# Patient Record
Sex: Female | Born: 1960 | Race: Black or African American | Hispanic: No | Marital: Single | Smoking: Never smoker
Health system: Southern US, Community
[De-identification: ages and names within clinical notes are randomized; demographics above are authoritative.]

## PROBLEM LIST (undated history)

## (undated) DIAGNOSIS — I10 Essential (primary) hypertension: Secondary | ICD-10-CM

## (undated) DIAGNOSIS — D649 Anemia, unspecified: Secondary | ICD-10-CM

## (undated) DIAGNOSIS — R011 Cardiac murmur, unspecified: Secondary | ICD-10-CM

## (undated) DIAGNOSIS — F419 Anxiety disorder, unspecified: Secondary | ICD-10-CM

## (undated) DIAGNOSIS — E785 Hyperlipidemia, unspecified: Secondary | ICD-10-CM

## (undated) HISTORY — PX: REPLACEMENT TOTAL KNEE: SUR1224

## (undated) HISTORY — DX: Essential (primary) hypertension: I10

## (undated) HISTORY — PX: ABDOMINAL HYSTERECTOMY: SHX81

## (undated) HISTORY — PX: ARTHROSCOPIC REPAIR ACL: SUR80

## (undated) HISTORY — DX: Hyperlipidemia, unspecified: E78.5

---

## 2003-01-25 HISTORY — PX: LAPAROSCOPIC SUPRACERVICAL HYSTERECTOMY: SUR797

## 2011-06-02 ENCOUNTER — Ambulatory Visit: Payer: Self-pay | Admitting: Unknown Physician Specialty

## 2019-03-13 DIAGNOSIS — Z23 Encounter for immunization: Secondary | ICD-10-CM | POA: Diagnosis not present

## 2019-04-03 DIAGNOSIS — Z23 Encounter for immunization: Secondary | ICD-10-CM | POA: Diagnosis not present

## 2019-05-01 ENCOUNTER — Ambulatory Visit: Payer: Self-pay | Admitting: Family Medicine

## 2019-05-29 ENCOUNTER — Other Ambulatory Visit: Payer: Self-pay

## 2019-05-29 ENCOUNTER — Encounter: Payer: Self-pay | Admitting: Family Medicine

## 2019-05-29 ENCOUNTER — Ambulatory Visit (INDEPENDENT_AMBULATORY_CARE_PROVIDER_SITE_OTHER): Payer: BC Managed Care – PPO | Admitting: Family Medicine

## 2019-05-29 VITALS — BP 154/98 | HR 90 | Temp 98.3°F | Resp 22 | Ht 69.0 in | Wt 314.2 lb

## 2019-05-29 DIAGNOSIS — I10 Essential (primary) hypertension: Secondary | ICD-10-CM

## 2019-05-29 DIAGNOSIS — Z1211 Encounter for screening for malignant neoplasm of colon: Secondary | ICD-10-CM

## 2019-05-29 DIAGNOSIS — Z114 Encounter for screening for human immunodeficiency virus [HIV]: Secondary | ICD-10-CM

## 2019-05-29 DIAGNOSIS — Z1159 Encounter for screening for other viral diseases: Secondary | ICD-10-CM | POA: Diagnosis not present

## 2019-05-29 DIAGNOSIS — Z6841 Body Mass Index (BMI) 40.0 and over, adult: Secondary | ICD-10-CM

## 2019-05-29 MED ORDER — LISINOPRIL 10 MG PO TABS: 10.0000 mg | ORAL_TABLET | Freq: Every day | ORAL | 3 refills | Status: DC

## 2019-05-29 NOTE — Progress Notes (Signed)
Subjective:     Akila Batta is a 59 y.o. female presenting for Establish Care (no PCP since around 2013 in [place]) and Back Pain (off and on pain in the Left lower back. x 1 week)     HPI  #HTN - previously diagnosed - no ha, vision change, sob, palpitations - did have some cp yesterday - but has a lot of anxiety and stressors - does not check blood pressure  #overweight - not exercising - knows what to eat, but has a hard time doing it - had lost 40 lbs down to 263 lbs was 303 lbs - has regained what she lost and more  #Back pain - lower left side - on and off - has happened a few times - was a few months ago - symptoms have resolve - no specific injury - nothing made it worse  Review of Systems   Social History   Tobacco Use  Smoking Status Never Smoker  Smokeless Tobacco Never Used        Objective:    BP Readings from Last 3 Encounters:  05/29/19 (!) 154/98   Wt Readings from Last 3 Encounters:  05/29/19 (!) 314 lb 4 oz (142.5 kg)    BP (!) 154/98   Pulse 90   Temp 98.3 F (36.8 C)   Resp (!) 22   Ht 5\' 9"  (1.753 m)   Wt (!) 314 lb 4 oz (142.5 kg)   SpO2 98%   BMI 46.41 kg/m    Physical Exam Constitutional:      General: She is not in acute distress.    Appearance: She is well-developed. She is obese. She is not diaphoretic.  HENT:     Right Ear: External ear normal.     Left Ear: External ear normal.     Nose: Nose normal.  Eyes:     Conjunctiva/sclera: Conjunctivae normal.  Cardiovascular:     Rate and Rhythm: Normal rate and regular rhythm.     Heart sounds: No murmur.  Pulmonary:     Effort: Pulmonary effort is normal. No respiratory distress.     Breath sounds: Normal breath sounds. No wheezing.  Musculoskeletal:     Cervical back: Neck supple.  Skin:    General: Skin is warm and dry.     Capillary Refill: Capillary refill takes less than 2 seconds.  Neurological:     Mental Status: She is alert. Mental status is at  baseline.  Psychiatric:        Mood and Affect: Mood normal.        Behavior: Behavior normal.    EKG: NSR, t-wave flattening, LVH based on voltage criteria. No acute ST changes       Assessment & Plan:   Problem List Items Addressed This Visit      Cardiovascular and Mediastinum   Hypertension - Primary    Given abnormal EKG and concern for LVH discussed importance of BP control. Will start lisinopril and lifestyle changes. Mychart in 2 weeks if bp not normal for dose increase. Return in 4 weeks for bp check and blood work. Labs today to screen for other risk factors.       Relevant Medications   ASPIRIN 81 PO   lisinopril (ZESTRIL) 10 MG tablet   Other Relevant Orders   TSH   Lipid panel   CBC   Comprehensive metabolic panel   Hemoglobin A1c   EKG 12-Lead (Completed)   ECHOCARDIOGRAM COMPLETE  Other   Morbid obesity with BMI of 45.0-49.9, adult ([place])    Motivated to work on diet and exercise. Discussed importance of commitment to lifestyle changes.       Relevant Orders   TSH   Lipid panel   CBC   Comprehensive metabolic panel   Hemoglobin A1c    Other Visit Diagnoses    Screening for HIV (human immunodeficiency virus)       Relevant Orders   HIV Antibody (routine testing w rflx)   Encounter for hepatitis C screening test for low risk patient       Relevant Orders   Hepatitis C antibody   Colon cancer screening       Relevant Orders   Fecal occult blood, imunochemical       Return in about 4 weeks (around 06/26/2019).  Lesleigh Noe, MD

## 2019-05-29 NOTE — Assessment & Plan Note (Signed)
Motivated to work on diet and exercise. Discussed importance of commitment to lifestyle changes.

## 2019-05-29 NOTE — Patient Instructions (Signed)
Your blood pressure high.   High blood pressure increases your risk for heart attack and stroke.    Start lisinopril 10 mg  If after 2 weeks blood pressure is still high, call or mychart to let me know and we will plan to increase medication.   Please check your blood pressure 2-4 times a week.   To check your blood pressure 1) Sit in a quiet and relaxed place for 5 minutes 2) Make sure your feet are flat on the ground 3) Consider checking first thing in the morning   Normal blood pressure is less than 140/90 Ideally you blood pressure should be around 120/80  Other ways you can reduce your blood pressure:  1) Regular exercise -- Try to get 150 minutes (30 minutes, 5 days a week) of moderate to vigorous aerobic excercise -- Examples: brisk walking (2.5 miles per hour), water aerobics, dancing, gardening, tennis, biking slower than 10 miles per hour 2) DASH Diet - low fat meats, more fresh fruits and vegetables, whole grains, low salt 3) Quit smoking if you smoke 4) Loose 5-10% of your body weight

## 2019-05-29 NOTE — Addendum Note (Signed)
Addended by: Alvina Chou on: 05/29/2019 02:26 PM   Modules accepted: Orders

## 2019-05-29 NOTE — Assessment & Plan Note (Signed)
Given abnormal EKG and concern for LVH discussed importance of BP control. Will start lisinopril and lifestyle changes. Mychart in 2 weeks if bp not normal for dose increase. Return in 4 weeks for bp check and blood work. Labs today to screen for other risk factors.

## 2019-06-05 ENCOUNTER — Other Ambulatory Visit: Payer: Self-pay

## 2019-06-05 ENCOUNTER — Other Ambulatory Visit (INDEPENDENT_AMBULATORY_CARE_PROVIDER_SITE_OTHER): Payer: BC Managed Care – PPO

## 2019-06-05 DIAGNOSIS — Z6841 Body Mass Index (BMI) 40.0 and over, adult: Secondary | ICD-10-CM

## 2019-06-05 DIAGNOSIS — Z114 Encounter for screening for human immunodeficiency virus [HIV]: Secondary | ICD-10-CM | POA: Diagnosis not present

## 2019-06-05 DIAGNOSIS — Z1211 Encounter for screening for malignant neoplasm of colon: Secondary | ICD-10-CM

## 2019-06-05 DIAGNOSIS — Z1159 Encounter for screening for other viral diseases: Secondary | ICD-10-CM

## 2019-06-05 DIAGNOSIS — I10 Essential (primary) hypertension: Secondary | ICD-10-CM | POA: Diagnosis not present

## 2019-06-05 LAB — COMPREHENSIVE METABOLIC PANEL
ALT: 47 U/L — ABNORMAL HIGH (ref 0–35)
AST: 56 U/L — ABNORMAL HIGH (ref 0–37)
Albumin: 4 g/dL (ref 3.5–5.2)
Alkaline Phosphatase: 49 U/L (ref 39–117)
BUN: 15 mg/dL (ref 6–23)
CO2: 29 mEq/L (ref 19–32)
Calcium: 9 mg/dL (ref 8.4–10.5)
Chloride: 104 mEq/L (ref 96–112)
Creatinine, Ser: 0.96 mg/dL (ref 0.40–1.20)
GFR: 71.81 mL/min (ref >60.00)
Glucose, Bld: 115 mg/dL — ABNORMAL HIGH (ref 70–99)
Potassium: 3.8 mEq/L (ref 3.5–5.1)
Sodium: 139 mEq/L (ref 135–145)
Total Bilirubin: 0.6 mg/dL (ref 0.2–1.2)
Total Protein: 6.9 g/dL (ref 6.0–8.3)

## 2019-06-05 LAB — CBC
HCT: 38.1 % (ref 36.0–46.0)
Hemoglobin: 12.9 g/dL (ref 12.0–15.0)
MCHC: 33.9 g/dL (ref 30.0–36.0)
MCV: 82.8 fl (ref 78.0–100.0)
Platelets: 278 10*3/uL (ref 150.0–400.0)
RBC: 4.6 Mil/uL (ref 3.87–5.11)
RDW: 15.5 % (ref 11.5–15.5)
WBC: 5.6 10*3/uL (ref 4.0–10.5)

## 2019-06-05 LAB — LIPID PANEL
Cholesterol: 238 mg/dL — ABNORMAL HIGH (ref 0–200)
HDL: 40.3 mg/dL (ref >39.00)
LDL Cholesterol: 172 mg/dL — ABNORMAL HIGH (ref 0–99)
NonHDL: 198.17
Total CHOL/HDL Ratio: 6
Triglycerides: 129 mg/dL (ref 0.0–149.0)
VLDL: 25.8 mg/dL (ref 0.0–40.0)

## 2019-06-05 LAB — HEMOGLOBIN A1C: Hgb A1c MFr Bld: 5.3 % (ref 4.6–6.5)

## 2019-06-05 LAB — TSH: TSH: 3.28 u[IU]/mL (ref 0.35–4.50)

## 2019-06-06 LAB — HEPATITIS C ANTIBODY
Hepatitis C Ab: NONREACTIVE
SIGNAL TO CUT-OFF: 0.29 (ref <1.00)

## 2019-06-06 LAB — HIV ANTIBODY (ROUTINE TESTING W REFLEX): HIV 1&2 Ab, 4th Generation: NONREACTIVE

## 2019-06-13 ENCOUNTER — Other Ambulatory Visit (INDEPENDENT_AMBULATORY_CARE_PROVIDER_SITE_OTHER): Payer: BC Managed Care – PPO

## 2019-06-13 DIAGNOSIS — Z1211 Encounter for screening for malignant neoplasm of colon: Secondary | ICD-10-CM | POA: Diagnosis not present

## 2019-06-13 LAB — FECAL OCCULT BLOOD, IMMUNOCHEMICAL: Fecal Occult Bld: NEGATIVE

## 2019-06-27 ENCOUNTER — Encounter: Payer: Self-pay | Admitting: Family Medicine

## 2019-06-27 ENCOUNTER — Ambulatory Visit: Payer: BC Managed Care – PPO | Admitting: Family Medicine

## 2019-06-27 ENCOUNTER — Other Ambulatory Visit: Payer: Self-pay

## 2019-06-27 VITALS — BP 152/84 | HR 98 | Temp 98.9°F | Ht 69.0 in | Wt 306.0 lb

## 2019-06-27 DIAGNOSIS — E782 Mixed hyperlipidemia: Secondary | ICD-10-CM

## 2019-06-27 DIAGNOSIS — R7989 Other specified abnormal findings of blood chemistry: Secondary | ICD-10-CM | POA: Diagnosis not present

## 2019-06-27 DIAGNOSIS — I10 Essential (primary) hypertension: Secondary | ICD-10-CM

## 2019-06-27 DIAGNOSIS — Z6841 Body Mass Index (BMI) 40.0 and over, adult: Secondary | ICD-10-CM

## 2019-06-27 MED ORDER — LISINOPRIL 20 MG PO TABS: 20.0000 mg | ORAL_TABLET | Freq: Every day | ORAL | 1 refills | Status: DC

## 2019-06-27 MED ORDER — ATORVASTATIN CALCIUM 10 MG PO TABS
10.0000 mg | ORAL_TABLET | Freq: Every day | ORAL | 3 refills | Status: DC
Start: 2019-06-27 — End: 2023-01-13

## 2019-06-27 NOTE — Assessment & Plan Note (Signed)
Doing great with diet/exercise and 8 lbs weight loss in 1 month. Continue efforts towards weight loss. Return in 3 months

## 2019-06-27 NOTE — Progress Notes (Signed)
Subjective:     Marissa Roman is a 58 y.o. female presenting for Hypertension and Weight Check     HPI  #HTN - taking medication at night - no side effects - summer vacation  - has not checked her BP at home - no cp, sob, ha, vision changes  #obesity - has lost weight - cut out fried foods - exercising - not working  Review of Systems   Social History   Tobacco Use  Smoking Status Never Smoker  Smokeless Tobacco Never Used        Objective:    BP Readings from Last 3 Encounters:  06/27/19 (!) 152/84  05/29/19 (!) 154/98   Wt Readings from Last 3 Encounters:  06/27/19 (!) 306 lb (138.8 kg)  05/29/19 (!) 314 lb 4 oz (142.5 kg)    BP (!) 152/84   Pulse 98   Temp 98.9 F (37.2 C) (Temporal)   Ht 5\' 9"  (1.753 m)   Wt (!) 306 lb (138.8 kg)   SpO2 98%   BMI 45.19 kg/m    Physical Exam Constitutional:      General: She is not in acute distress.    Appearance: She is well-developed. She is not diaphoretic.  HENT:     Right Ear: External ear normal.     Left Ear: External ear normal.  Eyes:     Conjunctiva/sclera: Conjunctivae normal.  Cardiovascular:     Rate and Rhythm: Normal rate and regular rhythm.     Heart sounds: No murmur.  Pulmonary:     Effort: Pulmonary effort is normal. No respiratory distress.     Breath sounds: Normal breath sounds. No wheezing.  Musculoskeletal:     Cervical back: Neck supple.  Skin:    General: Skin is warm and dry.     Capillary Refill: Capillary refill takes less than 2 seconds.  Neurological:     Mental Status: She is alert. Mental status is at baseline.  Psychiatric:        Mood and Affect: Mood normal.        Behavior: Behavior normal.      The 10-year ASCVD risk score Mikey Bussing DC Jr., et al., 2013) is: 15%   Values used to calculate the score:     Age: 52 years     Sex: Female     Is Non-Hispanic African American: Yes     Diabetic: No     Tobacco smoker: No     Systolic Blood Pressure: 213 mmHg      Is BP treated: Yes     HDL Cholesterol: 40.3 mg/dL     Total Cholesterol: 238 mg/dL  Lipid Panel     Component Value Date/Time   CHOL 238 (H) 06/05/2019 0858   TRIG 129.0 06/05/2019 0858   HDL 40.30 06/05/2019 0858   CHOLHDL 6 06/05/2019 0858   VLDL 25.8 06/05/2019 0858   LDLCALC 172 (H) 06/05/2019 0858        Assessment & Plan:   Problem List Items Addressed This Visit      Cardiovascular and Mediastinum   Hypertension - Primary    BP improved on lisinopril 10 mg but not at goal. Increase to 20 mg. She will get home cuff and call in 3-4 weeks if still elevated. Anticipate adding thiazide at that time if still elevated. DASH diet provided today and continued to encourage life style changes. Return in 3 months. BMP for monitoring today      Relevant  Medications   ASPIRIN 81 PO   atorvastatin (LIPITOR) 10 MG tablet   lisinopril (ZESTRIL) 20 MG tablet   Other Relevant Orders   Basic metabolic panel     Other   Morbid obesity with BMI of 45.0-49.9, adult ([facility])    Doing great with diet/exercise and 8 lbs weight loss in 1 month. Continue efforts towards weight loss. Return in 3 months      Mixed hyperlipidemia    Discussed ASCVD risk 15% and advised statin and asa. She will start both. Repeat lipids in 3 months.       Relevant Medications   ASPIRIN 81 PO   atorvastatin (LIPITOR) 10 MG tablet   lisinopril (ZESTRIL) 20 MG tablet   Elevated LFTs    Reviewed labs from last month. Discussed continued lifestyle. Will repeat in 3 months, if still elevated anticipate liver US to evaluate further at that time.           Return in about 3 months (around 09/27/2019).  Lynnda Child, MD

## 2019-06-27 NOTE — Patient Instructions (Addendum)
Start taking 20 mg of Lisinopril (2 tablets) Refill of 20 mg lisinopril sent to pharmacy  If blood pressure at home is still >140/90 call or MyChart in 3-4 weeks    DASH Eating Plan DASH stands for "Dietary Approaches to Stop Hypertension." The DASH eating plan is a healthy eating plan that has been shown to reduce high blood pressure (hypertension). It may also reduce your risk for type 2 diabetes, heart disease, and stroke. The DASH eating plan may also help with weight loss. What are tips for following this plan?  General guidelines  Avoid eating more than 2,300 mg (milligrams) of salt (sodium) a day. If you have hypertension, you may need to reduce your sodium intake to 1,500 mg a day.  Limit alcohol intake to no more than 1 drink a day for nonpregnant women and 2 drinks a day for men. One drink equals 12 oz of beer, 5 oz of wine, or 1 oz of hard liquor.  Work with your health care provider to maintain a healthy body weight or to lose weight. Ask what an ideal weight is for you.  Get at least 30 minutes of exercise that causes your heart to beat faster (aerobic exercise) most days of the week. Activities may include walking, swimming, or biking.  Work with your health care provider or diet and nutrition specialist (dietitian) to adjust your eating plan to your individual calorie needs. Reading food labels   Check food labels for the amount of sodium per serving. Choose foods with less than 5 percent of the Daily Value of sodium. Generally, foods with less than 300 mg of sodium per serving fit into this eating plan.  To find whole grains, look for the word "whole" as the first word in the ingredient list. Shopping  Buy products labeled as "low-sodium" or "no salt added."  Buy fresh foods. Avoid canned foods and premade or frozen meals. Cooking  Avoid adding salt when cooking. Use salt-free seasonings or herbs instead of table salt or sea salt. Check with your health care  provider or pharmacist before using salt substitutes.  Do not fry foods. Cook foods using healthy methods such as baking, boiling, grilling, and broiling instead.  Cook with heart-healthy oils, such as olive, canola, soybean, or sunflower oil. Meal planning  Eat a balanced diet that includes: ? 5 or more servings of fruits and vegetables each day. At each meal, try to fill half of your plate with fruits and vegetables. ? Up to 6-8 servings of whole grains each day. ? Less than 6 oz of lean meat, poultry, or fish each day. A 3-oz serving of meat is about the same size as a deck of cards. One egg equals 1 oz. ? 2 servings of low-fat dairy each day. ? A serving of nuts, seeds, or beans 5 times each week. ? Heart-healthy fats. Healthy fats called Omega-3 fatty acids are found in foods such as flaxseeds and coldwater fish, like sardines, salmon, and mackerel.  Limit how much you eat of the following: ? Canned or prepackaged foods. ? Food that is high in trans fat, such as fried foods. ? Food that is high in saturated fat, such as fatty meat. ? Sweets, desserts, sugary drinks, and other foods with added sugar. ? Full-fat dairy products.  Do not salt foods before eating.  Try to eat at least 2 vegetarian meals each week.  Eat more home-cooked food and less restaurant, buffet, and fast food.  When eating at  a restaurant, ask that your food be prepared with less salt or no salt, if possible. What foods are recommended? The items listed may not be a complete list. Talk with your dietitian about what dietary choices are best for you. Grains Whole-grain or whole-wheat bread. Whole-grain or whole-wheat pasta. Brown rice. [place]. Bulgur. Whole-grain and low-sodium cereals. Pita bread. Low-fat, low-sodium crackers. Whole-wheat flour tortillas. Vegetables Fresh or frozen vegetables (raw, steamed, roasted, or grilled). Low-sodium or reduced-sodium tomato and vegetable juice. Low-sodium or  reduced-sodium tomato sauce and tomato paste. Low-sodium or reduced-sodium canned vegetables. Fruits All fresh, dried, or frozen fruit. Canned fruit in natural juice (without added sugar). Meat and other protein foods Skinless chicken or [place]. Ground chicken or [place]. Pork with fat trimmed off. Fish and seafood. Egg whites. Dried beans, peas, or lentils. Unsalted nuts, nut butters, and seeds. Unsalted canned beans. Lean cuts of beef with fat trimmed off. Low-sodium, lean deli meat. Dairy Low-fat (1%) or fat-free (skim) milk. Fat-free, low-fat, or reduced-fat cheeses. Nonfat, low-sodium ricotta or cottage cheese. Low-fat or nonfat yogurt. Low-fat, low-sodium cheese. Fats and oils Soft margarine without trans fats. Vegetable oil. Low-fat, reduced-fat, or light mayonnaise and salad dressings (reduced-sodium). Canola, safflower, olive, soybean, and sunflower oils. Avocado. Seasoning and other foods Herbs. Spices. Seasoning mixes without salt. Unsalted popcorn and pretzels. Fat-free sweets. What foods are not recommended? The items listed may not be a complete list. Talk with your dietitian about what dietary choices are best for you. Grains Baked goods made with fat, such as croissants, muffins, or some breads. Dry pasta or rice meal packs. Vegetables Creamed or fried vegetables. Vegetables in a cheese sauce. Regular canned vegetables (not low-sodium or reduced-sodium). Regular canned tomato sauce and paste (not low-sodium or reduced-sodium). Regular tomato and vegetable juice (not low-sodium or reduced-sodium). Angie Fava. Olives. Fruits Canned fruit in a light or heavy syrup. Fried fruit. Fruit in cream or butter sauce. Meat and other protein foods Fatty cuts of meat. Ribs. Fried meat. Berniece Salines. Sausage. Bologna and other processed lunch meats. Salami. Fatback. Hotdogs. Bratwurst. Salted nuts and seeds. Canned beans with added salt. Canned or smoked fish. Whole eggs or egg yolks. Chicken or [place]  with skin. Dairy Whole or 2% milk, cream, and half-and-half. Whole or full-fat cream cheese. Whole-fat or sweetened yogurt. Full-fat cheese. Nondairy creamers. Whipped toppings. Processed cheese and cheese spreads. Fats and oils Butter. Stick margarine. Lard. Shortening. Ghee. Bacon fat. Tropical oils, such as coconut, palm kernel, or palm oil. Seasoning and other foods Salted popcorn and pretzels. Onion salt, garlic salt, seasoned salt, table salt, and sea salt. Worcestershire sauce. Tartar sauce. Barbecue sauce. Teriyaki sauce. Soy sauce, including reduced-sodium. Steak sauce. Canned and packaged gravies. Fish sauce. Oyster sauce. Cocktail sauce. Horseradish that you find on the shelf. Ketchup. Mustard. Meat flavorings and tenderizers. Bouillon cubes. Hot sauce and Tabasco sauce. Premade or packaged marinades. Premade or packaged taco seasonings. Relishes. Regular salad dressings. Where to find more information:  National Heart, Lung, and [place]: https://wilson-eaton.com/  American Heart Association: www.heart.org Summary  The DASH eating plan is a healthy eating plan that has been shown to reduce high blood pressure (hypertension). It may also reduce your risk for type 2 diabetes, heart disease, and stroke.  With the DASH eating plan, you should limit salt (sodium) intake to 2,300 mg a day. If you have hypertension, you may need to reduce your sodium intake to 1,500 mg a day.  When on the DASH eating plan, aim to eat  more fresh fruits and vegetables, whole grains, lean proteins, low-fat dairy, and heart-healthy fats.  Work with your health care provider or diet and nutrition specialist (dietitian) to adjust your eating plan to your individual calorie needs. This information is not intended to replace advice given to you by your health care provider. Make sure you discuss any questions you have with your health care provider. Document Revised: 12/23/2016 Document Reviewed:  01/04/2016 Elsevier Patient Education  2020 [employer].

## 2019-06-27 NOTE — Assessment & Plan Note (Signed)
BP improved on lisinopril 10 mg but not at goal. Increase to 20 mg. She will get home cuff and call in 3-4 weeks if still elevated. Anticipate adding thiazide at that time if still elevated. DASH diet provided today and continued to encourage life style changes. Return in 3 months. BMP for monitoring today

## 2019-06-27 NOTE — Assessment & Plan Note (Signed)
Discussed ASCVD risk 15% and advised statin and asa. She will start both. Repeat lipids in 3 months.

## 2019-06-27 NOTE — Assessment & Plan Note (Signed)
Reviewed labs from last month. Discussed continued lifestyle. Will repeat in 3 months, if still elevated anticipate liver US to evaluate further at that time.

## 2019-06-28 ENCOUNTER — Encounter: Payer: Self-pay | Admitting: Family Medicine

## 2019-06-28 LAB — BASIC METABOLIC PANEL
BUN: 14 mg/dL (ref 6–23)
CO2: 27 mEq/L (ref 19–32)
Calcium: 9.5 mg/dL (ref 8.4–10.5)
Chloride: 101 mEq/L (ref 96–112)
Creatinine, Ser: 1.07 mg/dL (ref 0.40–1.20)
GFR: 63.34 mL/min (ref >60.00)
Glucose, Bld: 92 mg/dL (ref 70–99)
Potassium: 4.1 mEq/L (ref 3.5–5.1)
Sodium: 137 mEq/L (ref 135–145)

## 2019-07-11 ENCOUNTER — Other Ambulatory Visit: Payer: Self-pay

## 2019-07-11 ENCOUNTER — Ambulatory Visit (INDEPENDENT_AMBULATORY_CARE_PROVIDER_SITE_OTHER): Payer: BC Managed Care – PPO

## 2019-07-11 DIAGNOSIS — I10 Essential (primary) hypertension: Secondary | ICD-10-CM | POA: Diagnosis not present

## 2019-07-12 ENCOUNTER — Encounter: Payer: Self-pay | Admitting: Family Medicine

## 2019-07-22 ENCOUNTER — Ambulatory Visit: Payer: BC Managed Care – PPO | Admitting: Family Medicine

## 2019-08-15 ENCOUNTER — Encounter: Payer: Self-pay | Admitting: Family Medicine

## 2019-09-09 ENCOUNTER — Ambulatory Visit: Payer: BC Managed Care – PPO | Admitting: Family Medicine

## 2019-11-12 ENCOUNTER — Ambulatory Visit: Payer: BC Managed Care – PPO | Admitting: Family Medicine

## 2020-01-13 ENCOUNTER — Ambulatory Visit: Payer: BC Managed Care – PPO | Admitting: Family Medicine

## 2020-03-23 ENCOUNTER — Encounter: Payer: Self-pay | Admitting: Family Medicine

## 2020-03-24 ENCOUNTER — Other Ambulatory Visit: Payer: Self-pay

## 2020-03-24 DIAGNOSIS — I10 Essential (primary) hypertension: Secondary | ICD-10-CM

## 2020-03-24 MED ORDER — LISINOPRIL 20 MG PO TABS: 20.0000 mg | ORAL_TABLET | Freq: Every day | ORAL | 0 refills | Status: DC

## 2020-04-06 ENCOUNTER — Ambulatory Visit: Payer: BC Managed Care – PPO | Admitting: Family Medicine

## 2020-05-10 ENCOUNTER — Other Ambulatory Visit: Payer: Self-pay

## 2020-05-10 ENCOUNTER — Encounter: Payer: Self-pay | Admitting: Emergency Medicine

## 2020-05-10 ENCOUNTER — Emergency Department
Admission: EM | Admit: 2020-05-10 | Discharge: 2020-05-10 | Disposition: A | Discharge status: Home / Self Care | Payer: BC Managed Care – PPO | Attending: Emergency Medicine | Admitting: Emergency Medicine

## 2020-05-10 ENCOUNTER — Emergency Department: Payer: BC Managed Care – PPO

## 2020-05-10 DIAGNOSIS — I10 Essential (primary) hypertension: Secondary | ICD-10-CM | POA: Diagnosis not present

## 2020-05-10 DIAGNOSIS — M160 Bilateral primary osteoarthritis of hip: Secondary | ICD-10-CM | POA: Insufficient documentation

## 2020-05-10 DIAGNOSIS — Z7982 Long term (current) use of aspirin: Secondary | ICD-10-CM | POA: Diagnosis not present

## 2020-05-10 DIAGNOSIS — G8929 Other chronic pain: Secondary | ICD-10-CM | POA: Diagnosis not present

## 2020-05-10 DIAGNOSIS — M545 Low back pain, unspecified: Secondary | ICD-10-CM | POA: Diagnosis present

## 2020-05-10 DIAGNOSIS — M461 Sacroiliitis, not elsewhere classified: Secondary | ICD-10-CM | POA: Insufficient documentation

## 2020-05-10 DIAGNOSIS — Z79899 Other long term (current) drug therapy: Secondary | ICD-10-CM | POA: Insufficient documentation

## 2020-05-10 DIAGNOSIS — M47816 Spondylosis without myelopathy or radiculopathy, lumbar region: Secondary | ICD-10-CM | POA: Insufficient documentation

## 2020-05-10 DIAGNOSIS — M25562 Pain in left knee: Secondary | ICD-10-CM | POA: Insufficient documentation

## 2020-05-10 DIAGNOSIS — Z96652 Presence of left artificial knee joint: Secondary | ICD-10-CM | POA: Insufficient documentation

## 2020-05-10 DIAGNOSIS — Z6841 Body Mass Index (BMI) 40.0 and over, adult: Secondary | ICD-10-CM | POA: Diagnosis not present

## 2020-05-10 DIAGNOSIS — M47818 Spondylosis without myelopathy or radiculopathy, sacral and sacrococcygeal region: Secondary | ICD-10-CM

## 2020-05-10 DIAGNOSIS — R52 Pain, unspecified: Secondary | ICD-10-CM

## 2020-05-10 MED ORDER — MELOXICAM 7.5 MG PO TABS
7.5000 mg | ORAL_TABLET | Freq: Every day | ORAL | 0 refills | Status: AC
Start: 2020-05-10 — End: 2021-05-10

## 2020-05-10 NOTE — Discharge Instructions (Addendum)
You were seen today for low back pain and right hip pain.  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  I have sent in a prescription for an anti-inflammatory to take daily with food for the next week.  Please avoid all other NSAIDs OTC which include ibuprofen, Advil, Motrin, Aleve, naproxen.  Weight loss will help reduce the pain you feel in her joints.  Please follow-up with your PCP to discuss possible referral for physical therapy if pain persist.

## 2020-05-10 NOTE — ED Notes (Signed)
See triage note Presents with lower back pain  States pain is mainly to right lower back and moves into hip /leg  Ambulates with slight limp  Denies any fall

## 2020-05-10 NOTE — ED Provider Notes (Signed)
[facility] Emergency Department Provider Note ____________________________________________  Time seen: 0730  I have reviewed the triage vital signs and the nursing notes.  HISTORY  Chief Complaint  Back Pain   HPI Marissa Roman is a 60 y.o. female presents to the ER today with complaint of back and right hip pain.  She reports this started 1 to 2 weeks ago.  She describes the pain as nagging and pulling.  The pain is worse when she lays down.  It improves with sitting, standing or walking.  She denies numbness, tingling or weakness of her lower extremities.  She denies any injury to the area but reports she has been having some issues with left knee pain.  She reports she has severe arthritis in the left knee and needs a knee replacement, however Dr. Sheppard Penton advised her that she needed to lose about 35 pounds before he would do her surgery.  She feels like she has been compensating for the left knee and that may be why her back and right hip are hurting.  She has taken Naproxen OTC with some relief of symptoms.  Past Medical History:  Diagnosis Date  . Hyperlipidemia   . Hypertension     Patient Active Problem List   Diagnosis Date Noted  . Mixed hyperlipidemia 06/27/2019  . Elevated LFTs 06/27/2019  . Hypertension 05/29/2019  . Morbid obesity with BMI of 45.0-49.9, adult (HCC) 05/29/2019    Past Surgical History:  Procedure Laterality Date  . ABDOMINAL HYSTERECTOMY     total  . ARTHROSCOPIC REPAIR ACL Left   . REPLACEMENT TOTAL KNEE Left     Prior to Admission medications   Medication Sig Start Date End Date Taking? Authorizing Provider  meloxicam (MOBIC) 7.5 MG tablet Take 1 tablet (7.5 mg total) by mouth daily. 05/10/20 05/10/21 Yes BaitySalvadore Oxford, NP  ASPIRIN 81 PO Take by mouth.    [provider]  ASPIRIN 81 PO Take 162 mg by mouth daily.    [provider]  atorvastatin (LIPITOR) 10 MG tablet Take 1 tablet (10 mg total) by mouth  daily. 06/27/19   Lynnda Child, MD  lisinopril (ZESTRIL) 20 MG tablet Take 1 tablet (20 mg total) by mouth daily. 03/24/20   Lynnda Child, MD  Multiple Vitamins-Minerals (CENTRUM SILVER PO) Take by mouth.    [provider]    Allergies Patient has no known allergies.  Family History  Problem Relation Age of Onset  . Hypertension Mother   . Lung cancer Father   . Pancreatic cancer Maternal Aunt   . Breast cancer Maternal Aunt     Social History Social History   Tobacco Use  . Smoking status: Never Smoker  . Smokeless tobacco: Never Used  Vaping Use  . Vaping Use: Never used  Substance Use Topics  . Alcohol use: Yes    Comment: beer-2 times a week  . Drug use: Never    Review of Systems  Constitutional: Negative for fever, chills or body aches. Cardiovascular: Negative for chest pain or chest tightness. Respiratory: Negative for cough or shortness of breath. Gastrointestinal: Negative for loss of bowel control. Genitourinary: Negative for loss of bladder control. Musculoskeletal: Positive for left knee pain, low back pain and right hip pain.  Negative for decrease in range of motion or difficulty with gait. Skin: Negative for rash. Neurological: Negative for focal weakness, tingling or numbness. ____________________________________________  PHYSICAL EXAM:  VITAL SIGNS: ED Triage Vitals [05/10/20 0132]  Enc  Vitals Group     BP (!) 151/85     Pulse Rate 80     Resp 18     Temp 98.2 F (36.8 C)     Temp Source Oral     SpO2 96 %     Weight (!) 310 lb (140.6 kg)     Height 5\' 10"  (1.778 m)     Head Circumference      Peak Flow      Pain Score 8     Pain Loc      Pain Edu?      Excl. in [place]?     Constitutional: Alert and oriented.  Obese, in no distress. Head: Normocephalic. Eyes:  Normal extraocular movements Cardiovascular: Normal rate, regular rhythm.  Pedal pulses 2+ bilaterally Respiratory: Normal respiratory effort. No  wheezes/rales/rhonchi. Musculoskeletal: Normal flexion, extension, rotation and lateral bending of the spine.  No bony tenderness noted over the spine.  Normal flexion, extension, abduction, abduction, internal and external rotation of the right hip.  No pain with palpation of the right hip.  Strength 5/5 BLE.  Gait steady without assistive device. Neurologic:  Normal speech and language. No gross focal neurologic deficits are appreciated. Skin:  Skin is warm, dry and intact. No rash noted.  ____________________________________________   RADIOLOGY  Imaging Orders     DG Hip Unilat W or Wo Pelvis 2-3 Views Right     DG Lumbar Spine 2-3 Views IMPRESSION: 1. No acute osseous abnormality. 2. Mild bilateral hip arthrosis, right slightly greater than left.   IMPRESSION: 1. Diffuse mild degenerative disc disease throughout the lumbar levels. No acute osseous abnormality. 2. Mild bilateral SI joint arthrosis.   ____________________________________________   INITIAL IMPRESSION / ASSESSMENT AND PLAN / ED COURSE  Acute Low Back Pain, Acute Right Hip Pain secondary to Chronic Left Knee Pain:  DDx include lumbar strain, right hip strain, osteoarthritis of the lumbar spine, osteoarthritis of the right hip Xray lumbar consistent with degenerative disease in the lumbar spine and bilateral SI joints X-ray right hip consistent with degenerative disease in bilateral hips, R >L I agree that she is likely compensating for her left knee pain which is making her back and right hip pain worse She declines anti-inflammatories here in the ER Rx for Meloxicam 7.5 mg p.o. daily x1 week-avoid other NSAIDs OTC.  Kidney function reviewed-normal. Encourage stretching Discussed how weight loss would decrease the impact on her joints Advised her to follow-up with PCP to discuss possible referral for physical therapy ____________________________________________  FINAL CLINICAL IMPRESSION(S) / ED DIAGNOSES  Final  diagnoses:  Spondylosis of lumbar region without myelopathy or radiculopathy  SI joint arthritis  Primary osteoarthritis of both hips  Chronic pain of left knee  Class 3 severe obesity due to excess calories with serious comorbidity and body mass index (BMI) of 40.0 to 44.9 in adult [facility])      [facility], NP 05/10/20 05/12/20    [id number], MD 05/10/20 1539

## 2020-05-10 NOTE — ED Triage Notes (Signed)
Pt c/o pain in lower back pain and right  Hip as well , pt reports hx of recent pian in left knee and believes that the compensation for that pain has lead to this back and hip pain.  Pain better with sitting but worse with lying or standing; 8/10

## 2020-06-23 ENCOUNTER — Ambulatory Visit: Payer: BC Managed Care – PPO | Admitting: Family Medicine

## 2020-09-21 ENCOUNTER — Ambulatory Visit: Payer: BC Managed Care – PPO | Admitting: Family Medicine

## 2020-11-16 ENCOUNTER — Ambulatory Visit: Payer: BC Managed Care – PPO | Admitting: Family Medicine

## 2021-03-29 ENCOUNTER — Ambulatory Visit: Payer: BC Managed Care – PPO | Admitting: Family Medicine

## 2021-05-15 ENCOUNTER — Other Ambulatory Visit: Payer: Self-pay

## 2021-05-15 ENCOUNTER — Emergency Department
Admission: EM | Admit: 2021-05-15 | Discharge: 2021-05-15 | Disposition: A | Discharge status: Home / Self Care | Payer: Self-pay | Attending: Emergency Medicine | Admitting: Emergency Medicine

## 2021-05-15 DIAGNOSIS — I10 Essential (primary) hypertension: Secondary | ICD-10-CM | POA: Insufficient documentation

## 2021-05-15 DIAGNOSIS — R42 Dizziness and giddiness: Secondary | ICD-10-CM | POA: Insufficient documentation

## 2021-05-15 DIAGNOSIS — M542 Cervicalgia: Secondary | ICD-10-CM | POA: Insufficient documentation

## 2021-05-15 DIAGNOSIS — G44209 Tension-type headache, unspecified, not intractable: Secondary | ICD-10-CM | POA: Insufficient documentation

## 2021-05-15 LAB — BASIC METABOLIC PANEL
Anion gap: 6 (ref 5–15)
BUN: 15 mg/dL (ref 8–23)
CO2: 25 mmol/L (ref 22–32)
Calcium: 9.2 mg/dL (ref 8.9–10.3)
Chloride: 107 mmol/L (ref 98–111)
Creatinine, Ser: 0.93 mg/dL (ref 0.44–1.00)
GFR, Estimated: >60 mL/min (ref >60)
Glucose, Bld: 139 mg/dL — ABNORMAL HIGH (ref 70–99)
Potassium: 3.9 mmol/L (ref 3.5–5.1)
Sodium: 138 mmol/L (ref 135–145)

## 2021-05-15 LAB — CBC
HCT: 40.4 % (ref 36.0–46.0)
Hemoglobin: 13.1 g/dL (ref 12.0–15.0)
MCH: 26.4 pg (ref 26.0–34.0)
MCHC: 32.4 g/dL (ref 30.0–36.0)
MCV: 81.5 fL (ref 80.0–100.0)
Platelets: 281 10*3/uL (ref 150–400)
RBC: 4.96 MIL/uL (ref 3.87–5.11)
RDW: 13.8 % (ref 11.5–15.5)
WBC: 6.4 10*3/uL (ref 4.0–10.5)
nRBC: 0 % (ref 0.0–0.2)

## 2021-05-15 NOTE — ED Triage Notes (Addendum)
Patient to ER via POV with complaints left arm pain for months. Reports the last 4-5 days patient has also been experiencing a posterior headache and dizziness. Reports stressful home situation.  ?

## 2021-05-15 NOTE — Discharge Instructions (Addendum)
-  Please follow-up with your primary care provider in regards to anxiety/depression management. ?-Return to the emergency department at any time if you begin to experience any new or worsening symptoms ?

## 2021-05-15 NOTE — ED Notes (Signed)
Dc ppw provided.pt declines vs at dc. Pt verbalized consent for dc. Pt ambulatory off unit on foot with family alert and oriented x4 ?

## 2021-05-15 NOTE — ED Provider Notes (Signed)
? ?[facility] ?Provider Note ? ? ? Event Date/Time  ? First MD Initiated Contact with Patient 05/15/21 1257   ?  (approximate) ? ? ?History  ? ?Chief Complaint ?Dizziness ? ? ?HPI ?Marissa Roman is a 61 y.o. female, history of hypertension, hyperlipidemia, presents the emergency department for evaluation of dizziness.  Patient states that she has been going through a very stressful period in her life in regards with her mother.  She states that when she gets anxious, she feels dizzy for short period of time, as well as when she lays down.  In addition, she has also been experiencing mild neck pain with headache along the posterior aspect of the head for the past 4 to 5 days.  Additionally, she states that her left arm has been feeling stiff for the past 3 months.  She states that she is unsure if all the symptoms are connected, but is concerned that she may have something serious.  Denies fever/chills, chest pain, shortness of breath, abdominal pain, flank pain, nausea/vomiting, diarrhea, urinary symptoms, vision changes, hearing changes, or rash/lesions. ? ?History Limitations: No limitations ? ?    ? ? ?Physical Exam  ?Triage Vital Signs: ?ED Triage Vitals  ?Enc Vitals Group  ?   BP 05/15/21 1236 (!) 158/97  ?   Pulse Rate 05/15/21 1236 93  ?   Resp 05/15/21 1236 18  ?   Temp 05/15/21 1236 99 ?F (37.2 ?C)  ?   Temp Source 05/15/21 1236 Oral  ?   SpO2 05/15/21 1236 92 %  ?   Weight --   ?   Height 05/15/21 1234 5\' 10"  (1.778 m)  ?   Head Circumference --   ?   Peak Flow --   ?   Pain Score 05/15/21 1234 5  ?   Pain Loc --   ?   Pain Edu? --   ?   Excl. in [place]? --   ? ? ?Most recent vital signs: ?Vitals:  ? 05/15/21 1236  ?BP: (!) 158/97  ?Pulse: 93  ?Resp: 18  ?Temp: 99 ?F (37.2 ?C)  ?SpO2: 92%  ? ? ?General: Awake, NAD.  ?Skin: Warm, dry. No rashes or lesions.  ?Eyes: PERRL.  EOMI.  Conjunctivae normal.  No horizontal or vertical nystagmus ?CV: Good peripheral perfusion.  S1 and S2 present.  No  murmurs, rubs, or gallops. ?Resp: Normal effort.  Lung sounds are clear bilaterally. ?Abd: Soft, non-tender. No distention.  ?Neuro: At baseline. No gross neurological deficits.  Cranial nerves II through XII intact.  5/5 strength in upper and lower extremities. ? ?No bony tenderness along the left shoulder joint, patient maintains full range of motion.  Pulse, motor, sensation intact distally ? ?Focused Exam: Not applicable. ? ?Physical Exam ? ? ? ?ED Results / Procedures / Treatments  ?Labs ?(all labs ordered are listed, but only abnormal results are displayed) ?Labs Reviewed  ?BASIC METABOLIC PANEL - Abnormal; Notable for the following components:  ?    Result Value  ? Glucose, Bld 139 (*)   ? All other components within normal limits  ?CBC  ?URINALYSIS, ROUTINE W REFLEX MICROSCOPIC  ?CBG MONITORING, ED  ? ? ? ?EKG ?Sinus rhythm, rate of 94, left axis deviation present, no ST segment changes, no AV blocks, normal QRS interval. ? ? ?RADIOLOGY ? ?ED Provider Interpretation: Not applicable ? ?No results found. ? ?PROCEDURES: ? ?Critical Care performed: Not applicable ? ?Procedures ? ? ? ?MEDICATIONS ORDERED IN  ED: ?Medications - No data to display ? ? ?IMPRESSION / MDM / ASSESSMENT AND PLAN / ED COURSE  ?I reviewed the triage vital signs and the nursing notes. ?             ?               ? ?Differential diagnosis includes, but is not limited to, tension headache, anxiety, orthostatic hypotension, dehydration ? ?ED Course ?Patient appears well, vitals are within normal limits for the patient.  NAD. ? ?CBC shows no leukocytosis or anemia.  BMP unremarkable for kidney injury, or electrolyte abnormalities. ? ?Assessment/Plan ?Given the patient's history, physical exam, and lab work-up thus far, I do not suspect any serious or life-threatening pathology.  I suspect that the patient's symptoms likely related to stress/anxiety.  Her neck pain and headache appears consistent with tension headache.  Her dizziness appears  to be subjective and particularly related to episodes of anxiety that self resolved.  Very low suspicion for any neurological etiology.  Her EKG is unremarkable, low suspicion for cardiac pathology.  Her left shoulder pain likely due to musculoskeletal strain versus rotator cuff injury, very unlikely related to her other symptoms.  Spoke to the patient at length about her lab results and physical exam findings, and she states that she felt reassured.  Offered head CT imaging for further work-up, however she decided that she did not feel that this was necessary.  We will plan to discharge. ? ?Considered admission for this patient, but given the patient's stable presentation, unremarkable work-up, she is unlikely to benefit from admission. ? ?Provided the patient with anticipatory guidance, return precautions, and educational material. Encouraged the patient to return to the emergency department at any time if they begin to experience any new or worsening symptoms. Patient expressed understanding and agreed with the plan.  ? ?  ? ? ?FINAL CLINICAL IMPRESSION(S) / ED DIAGNOSES  ? ?Final diagnoses:  ?Dizziness  ? ? ? ?Rx / DC Orders  ? ?ED Discharge Orders   ? ? None  ? ?  ? ? ? ?Note:  This document was prepared using Dragon voice recognition software and may include unintentional dictation errors. ?  ?Teodoro Spray, [place] ?05/15/21 1635 ? ?  ?Rada Hay, MD ?05/16/21 1104 ? ?

## 2021-05-16 ENCOUNTER — Ambulatory Visit: Payer: Self-pay

## 2021-09-23 ENCOUNTER — Ambulatory Visit: Payer: Self-pay | Admitting: Family Medicine

## 2021-12-23 ENCOUNTER — Ambulatory Visit: Payer: Self-pay | Admitting: Family Medicine

## 2021-12-27 ENCOUNTER — Ambulatory Visit (INDEPENDENT_AMBULATORY_CARE_PROVIDER_SITE_OTHER)
Admission: RE | Admit: 2021-12-27 | Discharge: 2021-12-27 | Disposition: A | Discharge status: Home / Self Care | Payer: BC Managed Care – PPO | Source: Ambulatory Visit | Attending: Family Medicine | Admitting: Family Medicine

## 2021-12-27 ENCOUNTER — Encounter: Payer: Self-pay | Admitting: Family Medicine

## 2021-12-27 ENCOUNTER — Ambulatory Visit: Payer: BC Managed Care – PPO | Admitting: Family Medicine

## 2021-12-27 VITALS — BP 160/80 | HR 88 | Temp 99.0°F | Ht 68.75 in | Wt 304.5 lb

## 2021-12-27 DIAGNOSIS — M19012 Primary osteoarthritis, left shoulder: Secondary | ICD-10-CM

## 2021-12-27 DIAGNOSIS — M25512 Pain in left shoulder: Secondary | ICD-10-CM | POA: Diagnosis not present

## 2021-12-27 MED ORDER — CELECOXIB 200 MG PO CAPS: 200.0000 mg | ORAL_CAPSULE | Freq: Every day | ORAL | 2 refills | Status: DC

## 2021-12-27 NOTE — Progress Notes (Signed)
Marissa Nay T. Marlen Koman, MD, CAQ Sports Medicine [facility] [address] [place], [address]  Phone: [phone]  FAX: [phone]  Marissa Roman - 61 y.o. female  MRN [id number]  Date of Birth: 03-15-1960  Date: 12/27/2021  PCP: Gweneth Dimitri, MD  Referral: Gweneth Dimitri, MD  Chief Complaint  Patient presents with   Shoulder Pain    Left    Subjective:   Marissa Roman is a 61 y.o. very pleasant female patient with Body mass index is 45.29 kg/m. who presents with the following:  The patient presents with left shoulder pain:   L shoulder started to hurt in January.   IROM is really restricted.   Very pleasant young lady at age 19 and she presents with some left-sided shoulder pain.  She has not had any kind of inciting event or particular injury.  She has slowly been developing shoulder pain for about the last year, she has noticed some terminal motion pain.  She has some loss of terminal motion, particular in the plane of internal range of motion.  She is able to achieve full abduction and flexion, although the terminal endpoints do cause some pain.  She has not any kind of significant prior injury, fracture, dislocation in the affected joint.  She denies any EXTR or neck pain, she is not having any terminal motion neck pain, no radicular symptoms, numbness, or tingling.  Review of Systems is noted in the HPI, as appropriate  Objective:   BP (!) 160/80   Pulse 88   Temp 99 F (37.2 C) (Oral)   Ht 5' 8.75" (1.746 m)   Wt (!) 304 lb 8 oz (138.1 kg)   SpO2 96%   BMI 45.29 kg/m   GEN: No acute distress; alert,appropriate. PULM: Breathing comfortably in no respiratory distress PSYCH: Normally interactive.    Shoulder: L Inspection: No muscle wasting or winging Ecchymosis/edema: neg  AC joint, scapula, clavicle: NT Cervical spine: NT, full ROM Spurling's: neg Abduction: full, 5/5, pain with terminal motion Flexion: full,  5/5 IR, she lacks 35 degrees compared to the contralateral shoulder, lift-off: 5/5 ER at neutral: She lacks 15 degrees compared to the contralateral shoulder, 5/5 AC crossover and compression: Positive Neer: neg Hawkins: neg Drop Test: neg Empty Can: neg Supraspinatus insertion: NT Bicipital groove: NT Speed's: neg Yergason's: neg Sulcus sign: neg Scapular dyskinesis: none C5-T1 intact Sensation intact Grip 5/5   Laboratory and Imaging Data:  Assessment and Plan:     ICD-10-CM   1. Glenohumeral arthritis, left  M19.012     2. Acute pain of left shoulder  M25.512 DG Shoulder Left     Acute on chronic left shoulder arthritis with exacerbation.  On the plain films she does have some joint space loss, she has a large osteophyte complex at the proximal humerus inferiorly.  My suspicion is that the arthritic changes causing her lack of motion and internal range of motion and her pain with motion.  She is going to work on motion, and I am going to start her on some oral NSAIDs.  Medication Management during today's office visit: Meds ordered this encounter  Medications   celecoxib (CELEBREX) 200 MG capsule    Sig: Take 1 capsule (200 mg total) by mouth daily.    Dispense:  30 capsule    Refill:  2   Medications Discontinued During This Encounter  Medication Reason   ASPIRIN 81 PO Duplicate    Orders placed  today for conditions managed today: Orders Placed This Encounter  Procedures   DG Shoulder Left    Disposition: No follow-ups on file.  Dr[employer] One speech-to-text software was used for transcription in this dictation.  Possible transcriptional errors can occur using Animal nutritionist.   Signed,  Elpidio Galea. Leniya Breit, MD   Outpatient Encounter Medications as of 12/27/2021  Medication Sig   ASPIRIN 81 PO Take by mouth.   celecoxib (CELEBREX) 200 MG capsule Take 1 capsule (200 mg total) by mouth daily.   Multiple Vitamins-Minerals (CENTRUM SILVER PO) Take  by mouth.   atorvastatin (LIPITOR) 10 MG tablet Take 1 tablet (10 mg total) by mouth daily. (Patient not taking: Reported on 12/27/2021)   lisinopril (ZESTRIL) 20 MG tablet Take 1 tablet (20 mg total) by mouth daily. (Patient not taking: Reported on 12/27/2021)   [DISCONTINUED] ASPIRIN 81 PO Take 162 mg by mouth daily.   No facility-administered encounter medications on file as of 12/27/2021.

## 2022-02-02 IMAGING — CR DG HIP (WITH OR WITHOUT PELVIS) 2-3V*R*
3 series · 3 of 3 positions shown · non-contrast
Comparison: Contemporary lumbar radiographs

CLINICAL DATA: Lumbar spine and right hip pain

EXAM:
DG HIP (WITH OR WITHOUT PELVIS) 2-3V RIGHT

[hip ap]
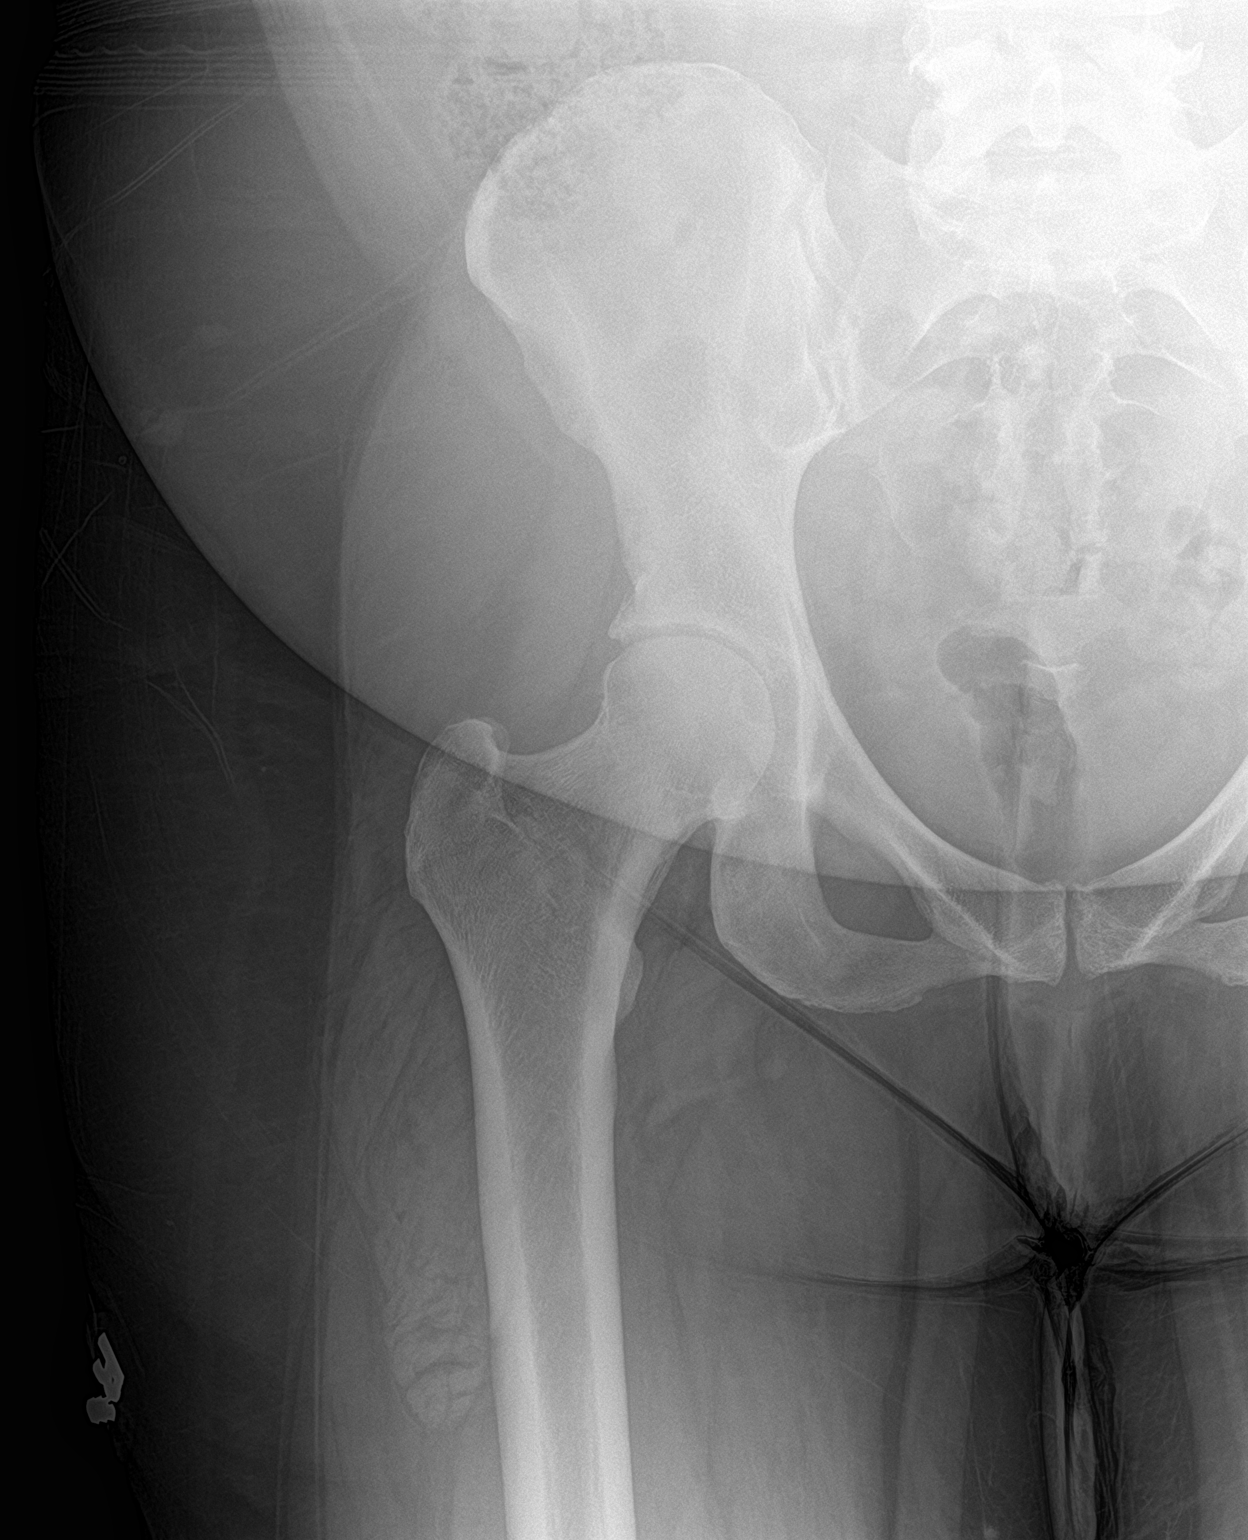

[hip lat]
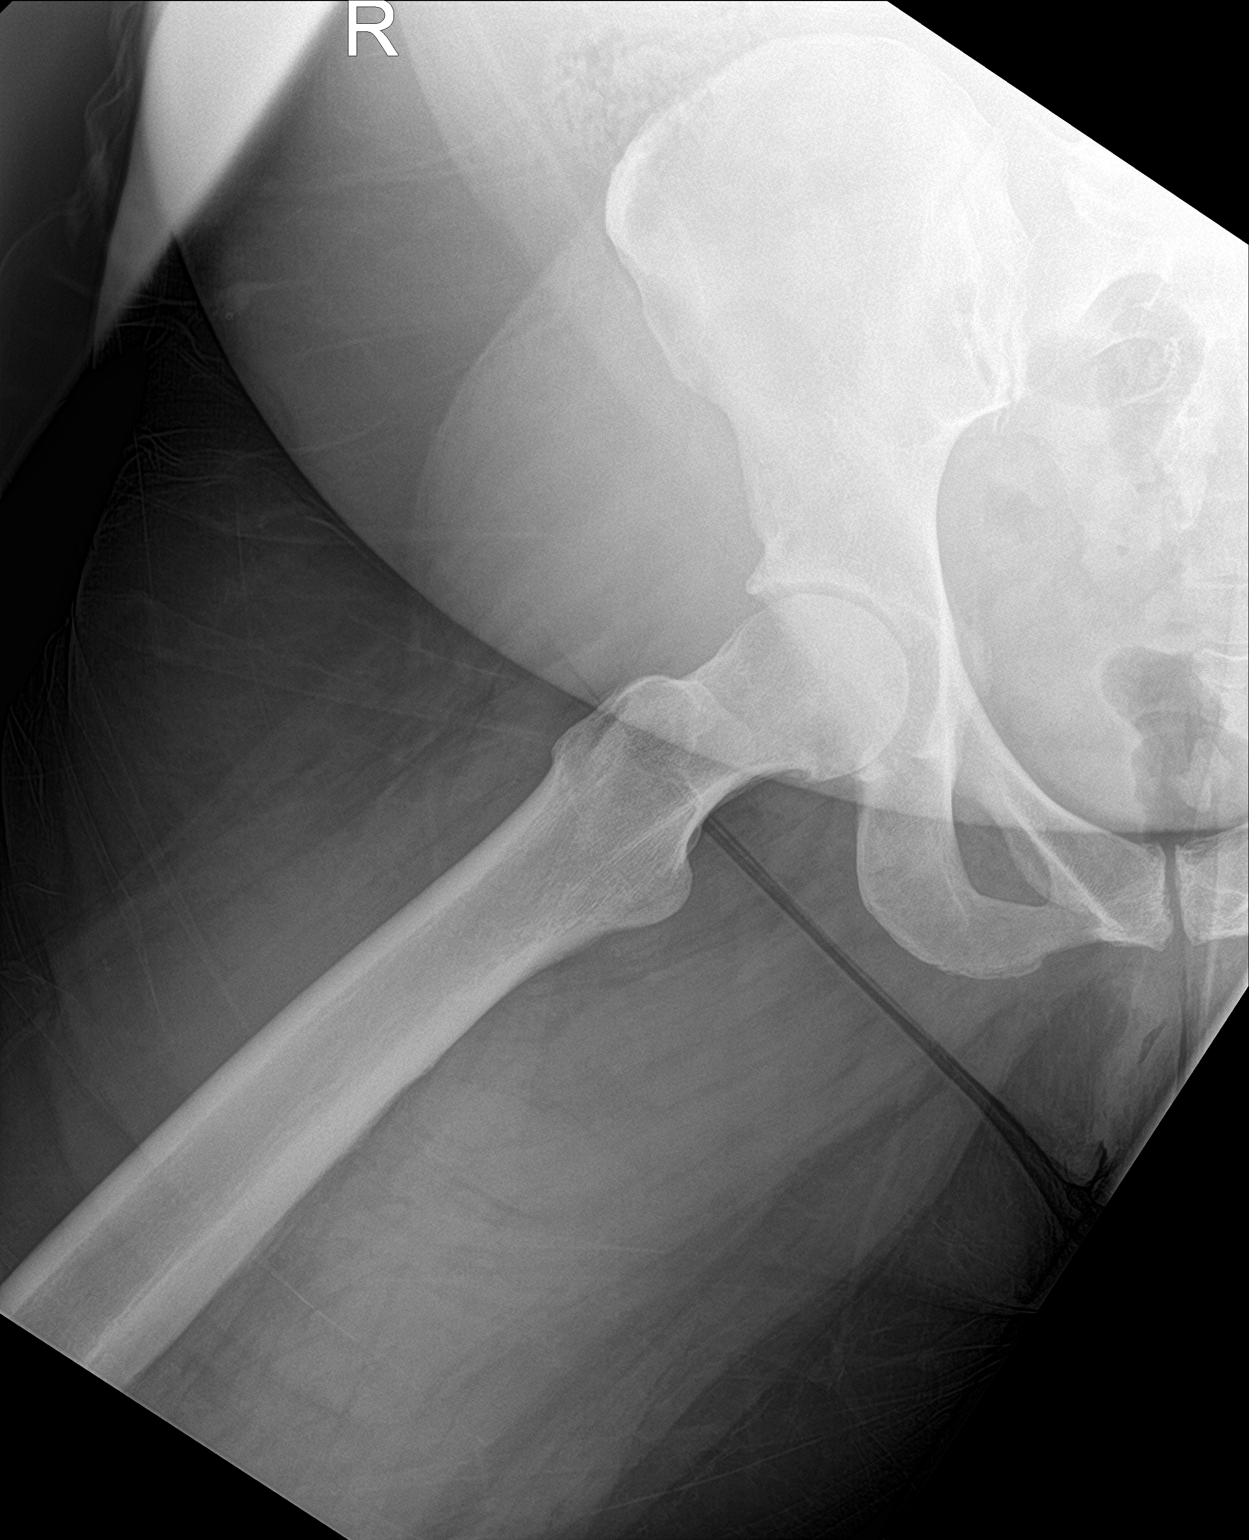

[pelvis ap]
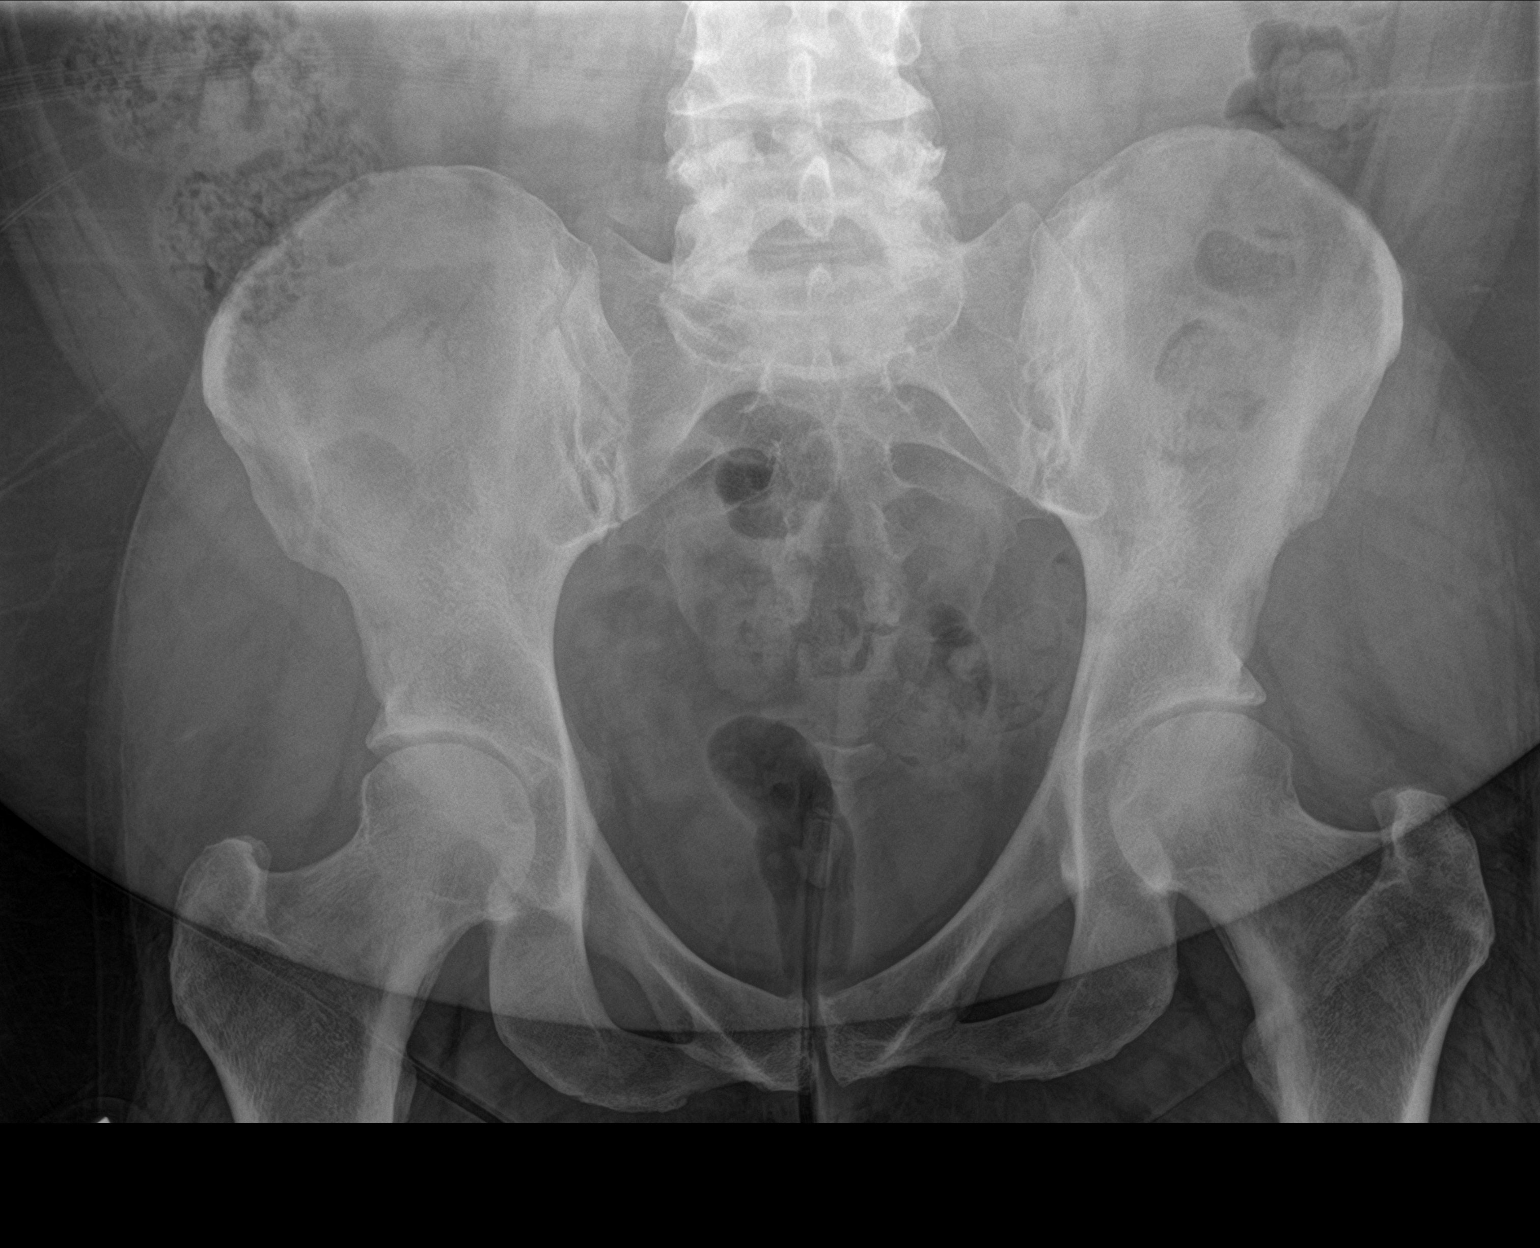

[3 of 3 positions shown; findings below may reference images not displayed]

FINDINGS: Bones of the pelvis are intact and congruent. Normal bone
mineralization without worrisome osseous lesion. Soft tissues are
free of acute abnormality. Mild bilateral SI joint and pubic
symphysis arthrosis. Mild bilateral hip arthrosis as well, right
slightly greater than left. Findings in the lumbar spine are better
detailed on dedicated radiographs.
IMPRESSION: 1. No acute osseous abnormality.
2. Mild bilateral hip arthrosis, right slightly greater than left.

## 2022-02-02 IMAGING — CR DG LUMBAR SPINE 2-3V
3 series · 3 of 3 positions shown · non-contrast
Comparison: Contemporary hip radiograph

CLINICAL DATA: Lower back and right hip pain, recent knee pain

EXAM:
LUMBAR SPINE - 2-3 VIEW

[l-spine ap]
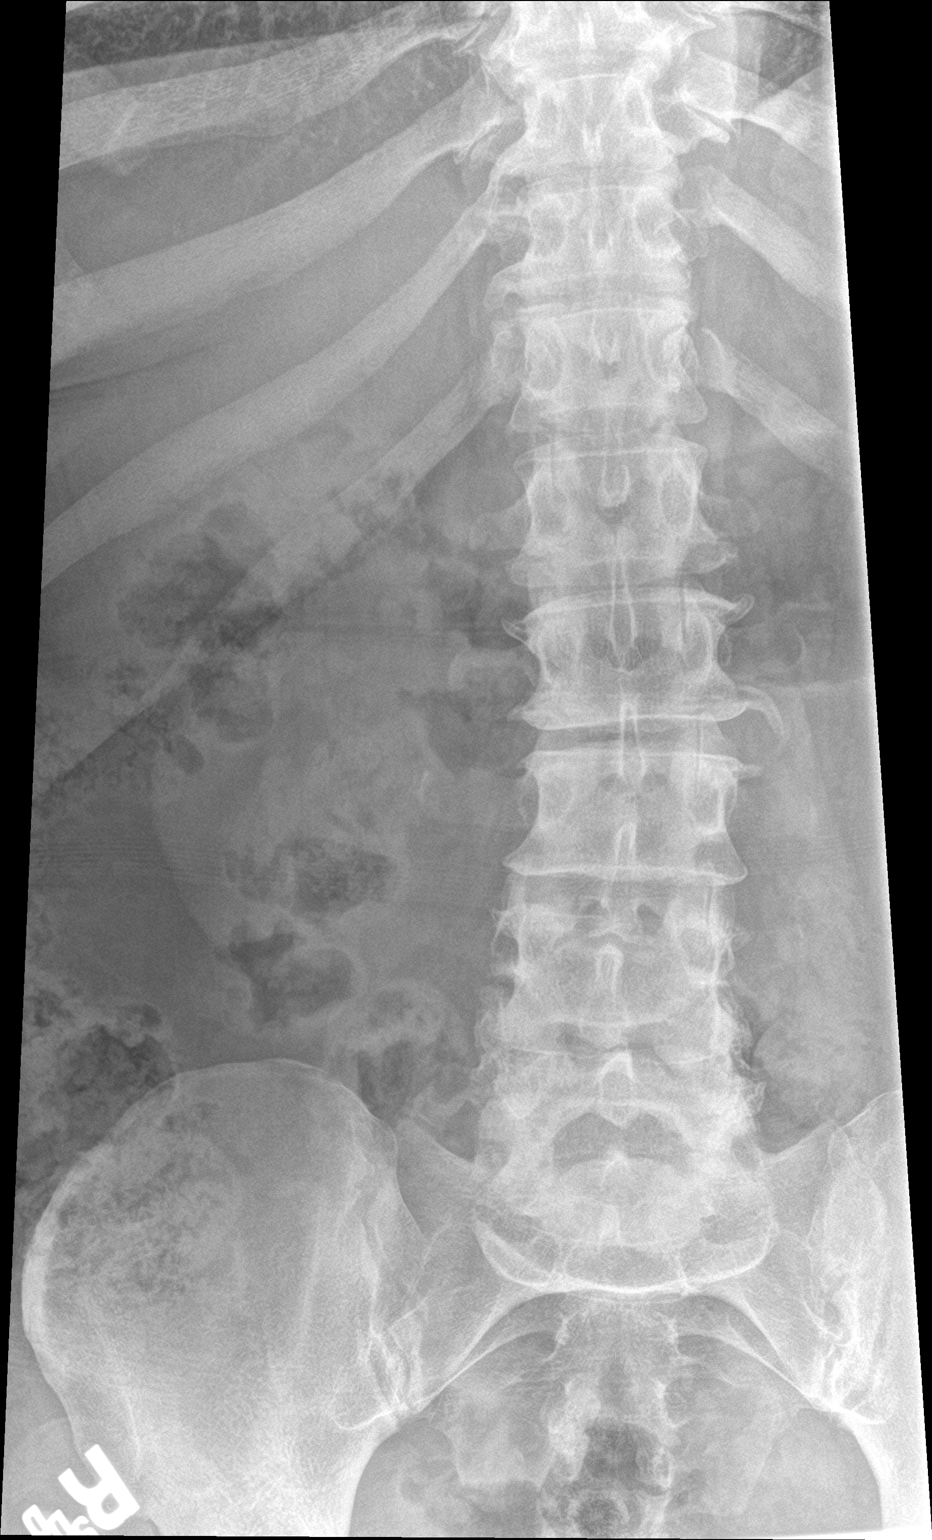

[l-spine lat]
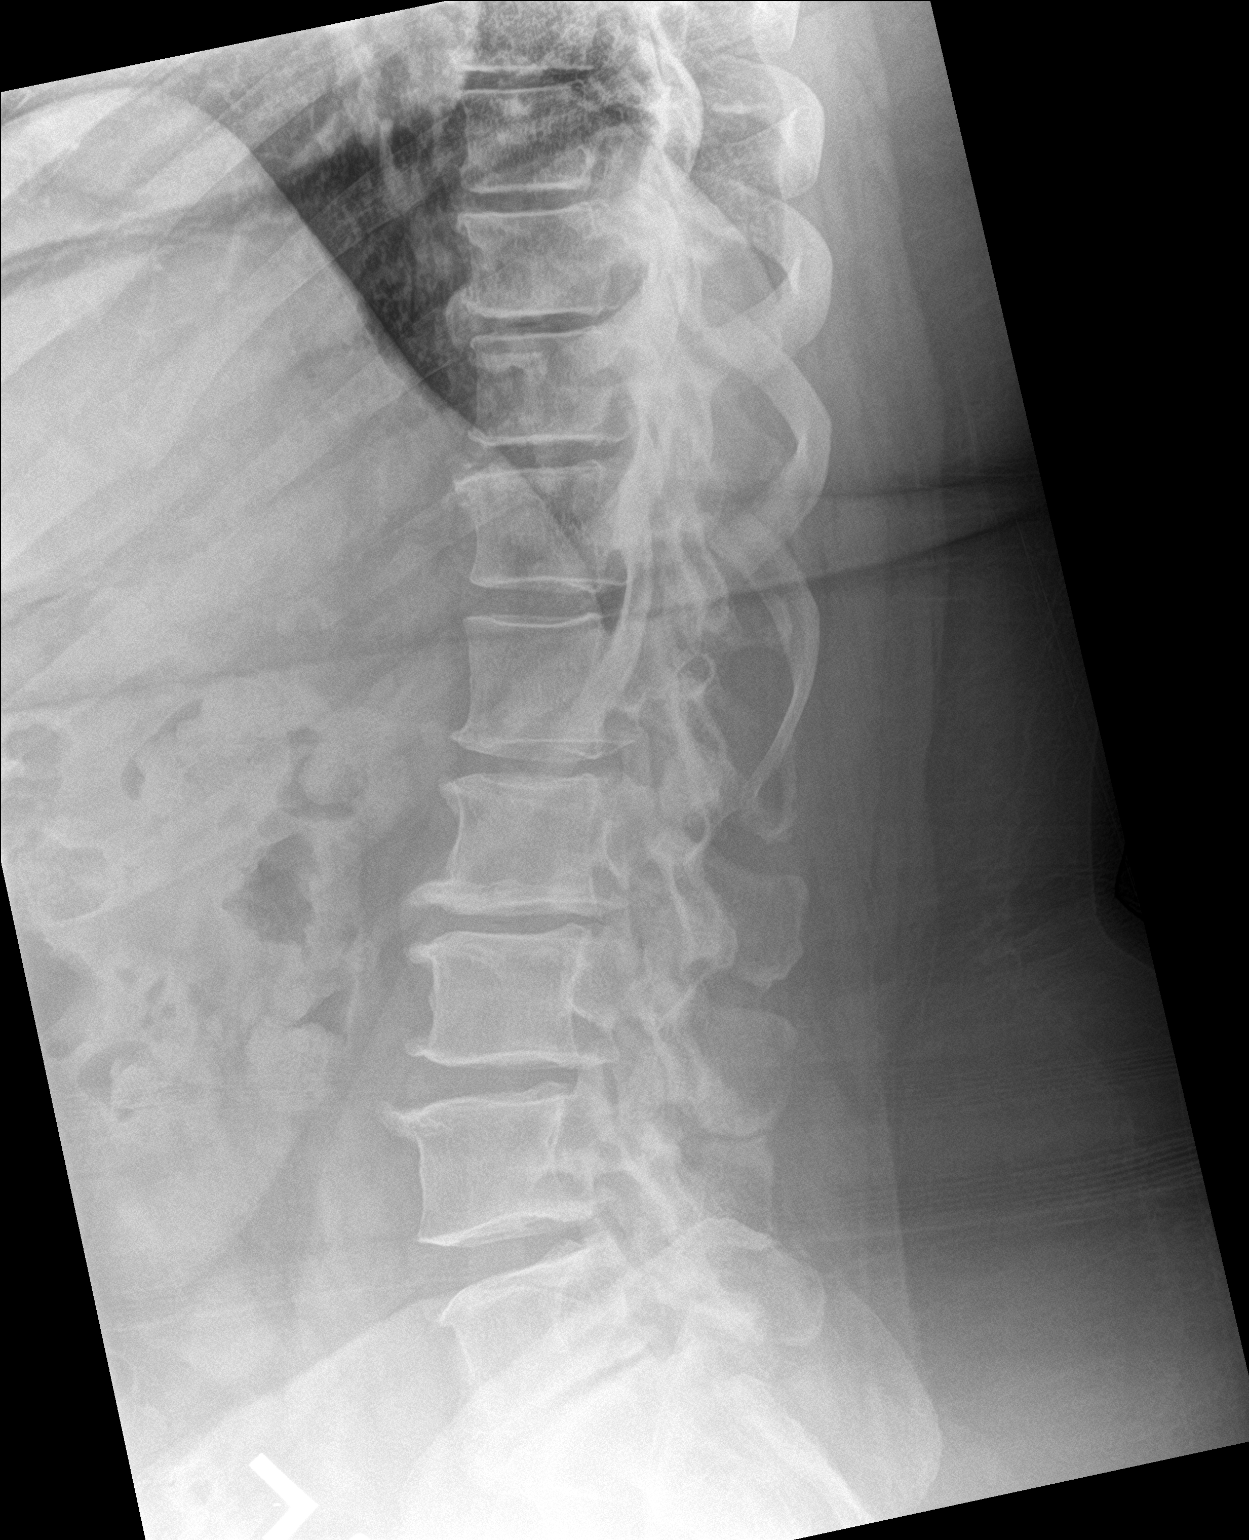

[l-spine spot]
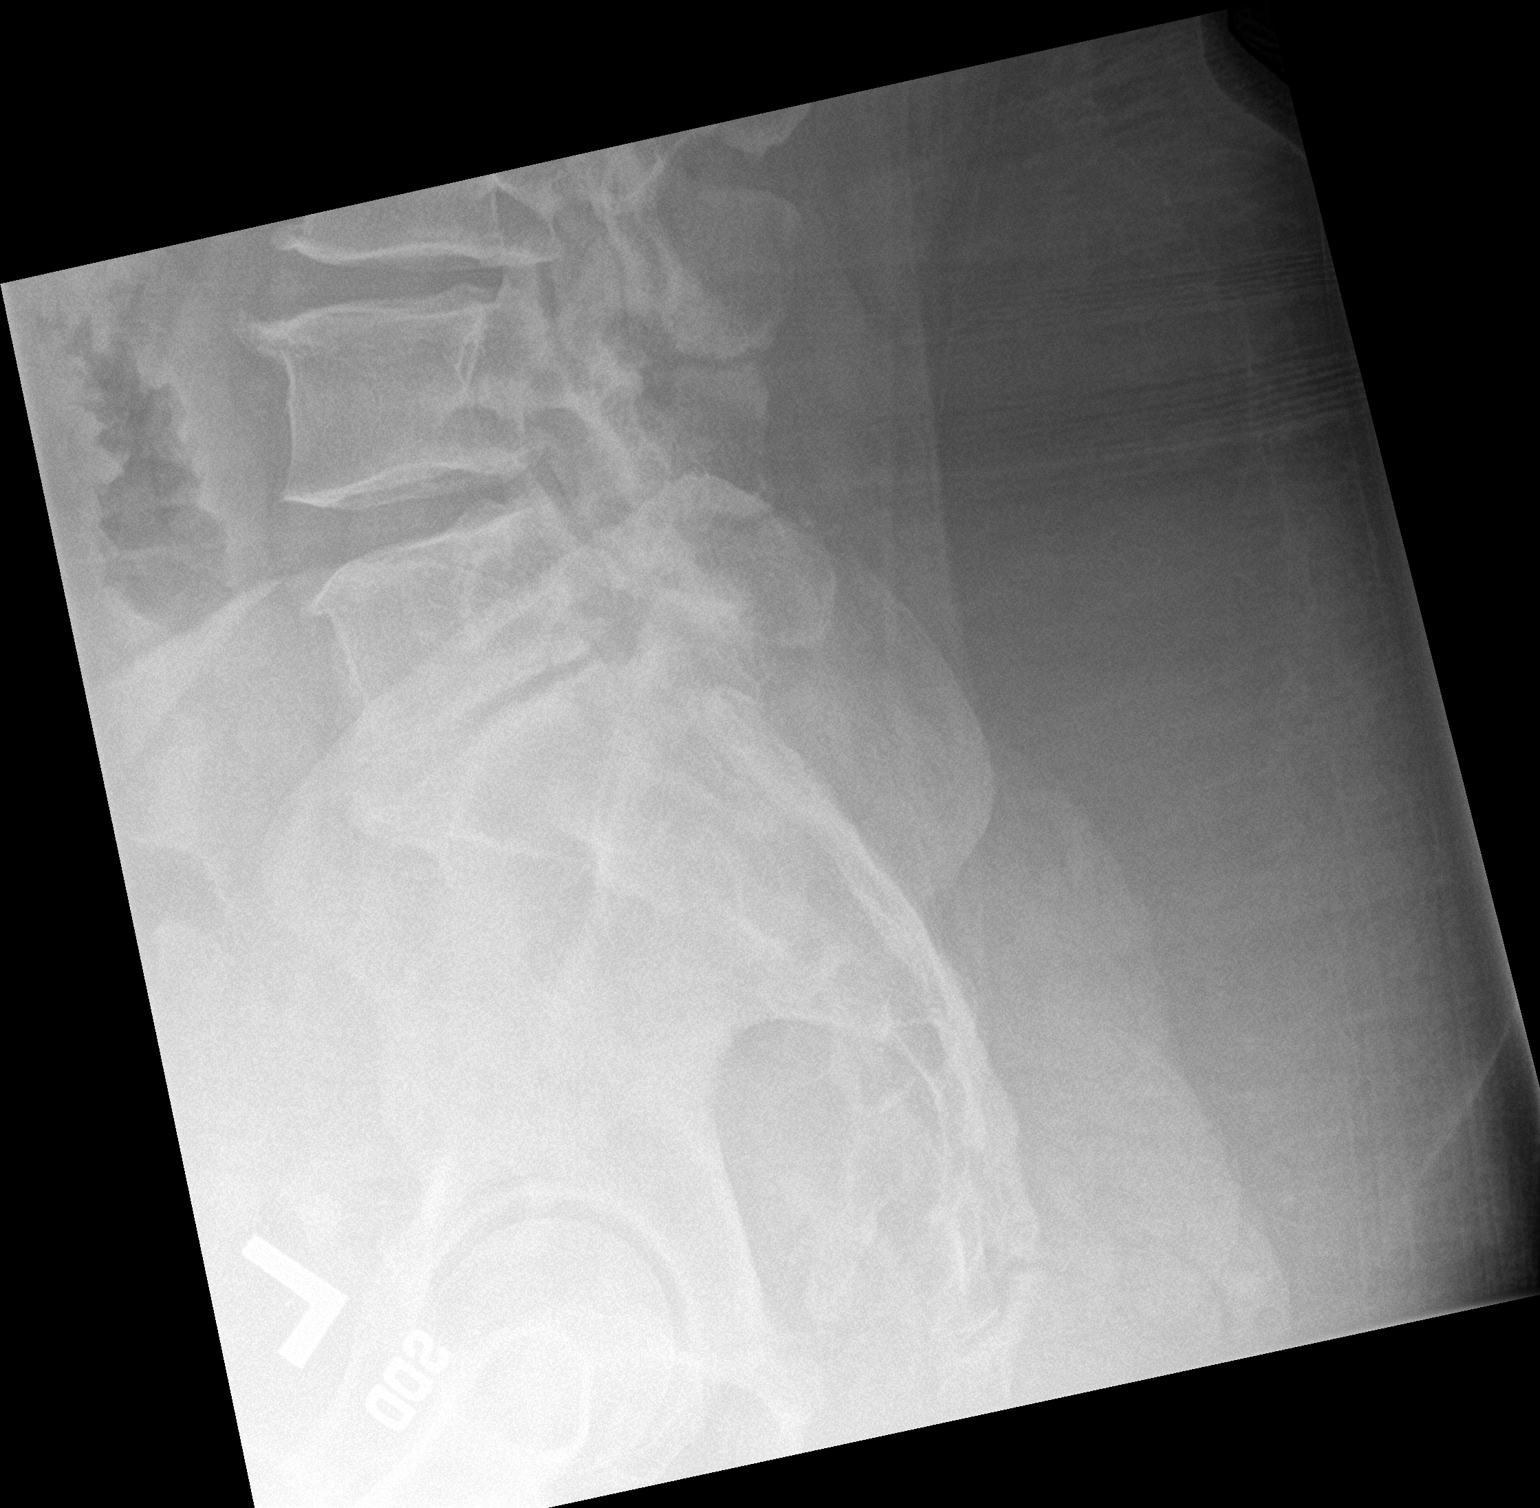

[3 of 3 positions shown; findings below may reference images not displayed]

FINDINGS: Five lumbar levels. No acute vertebral body fracture or height loss.
Very mild levocurvature. No significant spondylolisthesis. Normal
bone mineralization without suspicious lytic or blastic lesion.
Diffuse mild intervertebral disc height loss with discogenic and
facet degenerative changes throughout the lumbar levels. Some mild
interspinous arthrosis is present as well L3-S1. Mild bilateral SI
joint arthrosis is seen. Soft tissues are unremarkable.
IMPRESSION: 1. Diffuse mild degenerative disc disease throughout the lumbar
levels. No acute osseous abnormality.
2. Mild bilateral SI joint arthrosis.

## 2022-03-08 ENCOUNTER — Encounter: Payer: Self-pay | Admitting: Family

## 2023-01-13 ENCOUNTER — Other Ambulatory Visit: Payer: Self-pay

## 2023-01-13 ENCOUNTER — Encounter: Payer: Self-pay | Admitting: Family

## 2023-01-13 ENCOUNTER — Telehealth: Payer: Self-pay

## 2023-01-13 ENCOUNTER — Ambulatory Visit: Payer: BC Managed Care – PPO | Admitting: Family

## 2023-01-13 VITALS — BP 172/98 | HR 88 | Temp 97.8°F | Ht 68.75 in | Wt 301.8 lb

## 2023-01-13 DIAGNOSIS — Z90711 Acquired absence of uterus with remaining cervical stump: Secondary | ICD-10-CM

## 2023-01-13 DIAGNOSIS — E782 Mixed hyperlipidemia: Secondary | ICD-10-CM

## 2023-01-13 DIAGNOSIS — Z1211 Encounter for screening for malignant neoplasm of colon: Secondary | ICD-10-CM

## 2023-01-13 DIAGNOSIS — Z6841 Body Mass Index (BMI) 40.0 and over, adult: Secondary | ICD-10-CM | POA: Diagnosis not present

## 2023-01-13 DIAGNOSIS — M62838 Other muscle spasm: Secondary | ICD-10-CM

## 2023-01-13 DIAGNOSIS — R7989 Other specified abnormal findings of blood chemistry: Secondary | ICD-10-CM | POA: Diagnosis not present

## 2023-01-13 DIAGNOSIS — I10 Essential (primary) hypertension: Secondary | ICD-10-CM

## 2023-01-13 DIAGNOSIS — Z780 Asymptomatic menopausal state: Secondary | ICD-10-CM

## 2023-01-13 DIAGNOSIS — Z1272 Encounter for screening for malignant neoplasm of vagina: Secondary | ICD-10-CM

## 2023-01-13 DIAGNOSIS — Z1231 Encounter for screening mammogram for malignant neoplasm of breast: Secondary | ICD-10-CM

## 2023-01-13 DIAGNOSIS — Z0001 Encounter for general adult medical examination with abnormal findings: Secondary | ICD-10-CM

## 2023-01-13 DIAGNOSIS — Z0000 Encounter for general adult medical examination without abnormal findings: Secondary | ICD-10-CM

## 2023-01-13 LAB — CBC
HCT: 40 % (ref 36.0–46.0)
Hemoglobin: 13.2 g/dL (ref 12.0–15.0)
MCHC: 33 g/dL (ref 30.0–36.0)
MCV: 85 fL (ref 78.0–100.0)
Platelets: 251 10*3/uL (ref 150.0–400.0)
RBC: 4.7 Mil/uL (ref 3.87–5.11)
RDW: 16 % — ABNORMAL HIGH (ref 11.5–15.5)
WBC: 6.1 10*3/uL (ref 4.0–10.5)

## 2023-01-13 LAB — COMPREHENSIVE METABOLIC PANEL
ALT: 18 U/L (ref 0–35)
AST: 16 U/L (ref 0–37)
Albumin: 4.1 g/dL (ref 3.5–5.2)
Alkaline Phosphatase: 58 U/L (ref 39–117)
BUN: 16 mg/dL (ref 6–23)
CO2: 30 meq/L (ref 19–32)
Calcium: 9.1 mg/dL (ref 8.4–10.5)
Chloride: 104 meq/L (ref 96–112)
Creatinine, Ser: 1.02 mg/dL (ref 0.40–1.20)
GFR: 58.63 mL/min — ABNORMAL LOW (ref >60.00)
Glucose, Bld: 109 mg/dL — ABNORMAL HIGH (ref 70–99)
Potassium: 4.2 meq/L (ref 3.5–5.1)
Sodium: 140 meq/L (ref 135–145)
Total Bilirubin: 0.4 mg/dL (ref 0.2–1.2)
Total Protein: 6.8 g/dL (ref 6.0–8.3)

## 2023-01-13 LAB — LIPID PANEL
Cholesterol: 258 mg/dL — ABNORMAL HIGH (ref 0–200)
HDL: 52.4 mg/dL (ref >39.00)
LDL Cholesterol: 181 mg/dL — ABNORMAL HIGH (ref 0–99)
NonHDL: 205.22
Total CHOL/HDL Ratio: 5
Triglycerides: 122 mg/dL (ref 0.0–149.0)
VLDL: 24.4 mg/dL (ref 0.0–40.0)

## 2023-01-13 LAB — TSH: TSH: 2.7 u[IU]/mL (ref 0.35–5.50)

## 2023-01-13 LAB — HEMOGLOBIN A1C: Hgb A1c MFr Bld: 5.5 % (ref 4.6–6.5)

## 2023-01-13 LAB — MICROALBUMIN / CREATININE URINE RATIO
Creatinine,U: 97.2 mg/dL
Microalb Creat Ratio: 0.7 mg/g (ref 0.0–30.0)
Microalb, Ur: <0.7 mg/dL (ref 0.0–1.9)

## 2023-01-13 MED ORDER — BLOOD PRESSURE CUFF MISC: 1.0000 | Freq: Every day | 0 refills | Status: AC

## 2023-01-13 MED ORDER — CYCLOBENZAPRINE HCL 10 MG PO TABS: ORAL_TABLET | ORAL | 0 refills | Status: DC

## 2023-01-13 MED ORDER — NA SULFATE-K SULFATE-MG SULF 17.5-3.13-1.6 GM/177ML PO SOLN: 1.0000 | Freq: Once | ORAL | 0 refills | Status: AC

## 2023-01-13 MED ORDER — AMLODIPINE BESYLATE 5 MG PO TABS: 5.0000 mg | ORAL_TABLET | Freq: Every day | ORAL | 0 refills | Status: DC

## 2023-01-13 NOTE — Assessment & Plan Note (Signed)
Start amlodipine 5 mg once daily Pt advised of the following:  Continue medication as prescribed. Monitor blood pressure periodically and/or when you feel symptomatic. Goal is <130/90 on average. Ensure that you have rested for 30 minutes prior to checking your blood pressure. Record your readings and bring them to your next visit if necessary.work on a low sodium diet.  

## 2023-01-13 NOTE — Assessment & Plan Note (Signed)
The beneficiary does not have any FDA labeled contraindications to the requested agent including pregnancy, lactation, h/o medullary thyroid cancer or multiple endocrine neoplasia type II.   Pt advised to work on diet and exercise as tolerated

## 2023-01-13 NOTE — Telephone Encounter (Signed)
Gastroenterology Pre-Procedure Review  Request Date: 02/20/23 Requesting Physician: Dr. Allegra Lai  PATIENT REVIEW QUESTIONS: The patient responded to the following health history questions as indicated:    1. Are you having any GI issues? no 2. Do you have a personal history of Polyps? no 3. Do you have a family history of Colon Cancer or Polyps? no 4. Diabetes Mellitus? no 5. Joint replacements in the past 12 months?no 6. Major health problems in the past 3 months?no 7. Any artificial heart valves, MVP, or defibrillator?no    MEDICATIONS & ALLERGIES:    Patient reports the following regarding taking any anticoagulation/antiplatelet therapy:   Plavix, Coumadin, Eliquis, Xarelto, Lovenox, Pradaxa, Brilinta, or Effient? no Aspirin? 81 mg daily  Patient confirms/reports the following medications:  Current Outpatient Medications  Medication Sig Dispense Refill   amLODipine (NORVASC) 5 MG tablet Take 1 tablet (5 mg total) by mouth daily. 90 tablet 0   ASPIRIN 81 PO Take by mouth.     Blood Pressure Monitoring (BLOOD PRESSURE CUFF) MISC 1 each by Does not apply route daily. 1 each 0   cyclobenzaprine (FLEXERIL) 10 MG tablet Take 1/2 to 1 tablet po at bedtime prn muscle spasm 30 tablet 0   Multiple Vitamin (MULTIVITAMIN) capsule Take 1 capsule by mouth daily.     No current facility-administered medications for this visit.    Patient confirms/reports the following allergies:  No Known Allergies  No orders of the defined types were placed in this encounter.   AUTHORIZATION INFORMATION Primary Insurance: 1D#: Group #:  Secondary Insurance: 1D#: Group #:  SCHEDULE INFORMATION: Date: 02/20/23 Time: Location: [facility]

## 2023-01-13 NOTE — Patient Instructions (Signed)
  I have sent an electronic order over to your preferred location for the following:   []   2D Mammogram  [x]   3D Mammogram  []   Bone Density   Please give this center a call to get scheduled at your convenience.  [x]   [facility]  [address] [place] [address]  [phone]  Make sure to wear two piece  clothing  No lotions powders or deodorants the day of the appointment Make sure to bring picture ID and insurance card.  Bring list of medications you are currently taking including any supplements.    ------------------------------------

## 2023-01-13 NOTE — Progress Notes (Unsigned)
o  Established Patient Office Visit  Subjective:  Patient ID: Marissa Roman, female    DOB: 1960-05-11  Age: 62 y.o. MRN: [id number]  CC:  Chief Complaint  Patient presents with   Establish Care    HPI Marissa Roman is here for a transition of care visit as well as here for annual exam.   Prior provider was: Dr. Selena Batten    chronic concerns:  HTN: took herself off of lisinopril 20 mg once daily.  Does not check her blood pressure at home.   Obesity: trying to work on diet and exercise but works nights which   HLD: was on atorvastatin 10 mg but took herself off because she didn't want to be on it at the time.   Pt states that while she was cleaning out the garage and getting the christmas decorations out and her back tightened up and 'flared', this was mid back into waist area. Limitations were with bending over and with not lifting anything heavy, but sits in a chair throughout the day for 12 hours makes it hard for her to stand up after sitting down. Did take tylenol and excedrin. She has been out of work since the 15th and she doesn't want to go back until the 27th as she would like to give herself some time to improve with movement and stiffness prior to having to sit her entire workday which she states aggravates her back.    Utd on eye and dental exams.  Regular diet, no dedicated exercise routine.  Sleeps fairly well due to shift work.  No advanced directive or living will.     Wt Readings from Last 3 Encounters:  01/13/23 (!) 301 lb 12.8 oz (136.9 kg)  12/27/21 (!) 304 lb 8 oz (138.1 kg)  05/10/20 (!) 310 lb (140.6 kg)   Temp Readings from Last 3 Encounters:  01/13/23 97.8 F (36.6 C) (Temporal)  12/27/21 99 F (37.2 C) (Oral)  05/15/21 99 F (37.2 C) (Oral)   BP Readings from Last 3 Encounters:  01/13/23 (!) 172/98  12/27/21 (!) 160/80  05/15/21 (!) 158/97   Pulse Readings from Last 3 Encounters:  01/13/23 88  12/27/21 88  05/15/21 93      Past Medical  History:  Diagnosis Date   Hyperlipidemia    Hypertension     Past Surgical History:  Procedure Laterality Date   ARTHROSCOPIC REPAIR ACL Left    LAPAROSCOPIC SUPRACERVICAL HYSTERECTOMY  2005   REPLACEMENT TOTAL KNEE Left     Family History  Problem Relation Age of Onset   Hypertension Mother    Lung cancer Father    Pancreatic cancer Maternal Aunt    Breast cancer Maternal Aunt     Social History   Socioeconomic History   Marital status: Single    Spouse name: Not on file   Number of children: 0   Years of education: college   Highest education level: Not on file  Occupational History    Comment: [employer], Corporate treasurer  Tobacco Use   Smoking status: Never   Smokeless tobacco: Never  Vaping Use   Vaping status: Never Used  Substance and Sexual Activity   Alcohol use: Yes    Comment: beer and bourbon-3 times a week   Drug use: Not Currently    Types: Marijuana   Sexual activity: Not Currently  Other Topics Concern   Not on file  Social History Narrative   05/29/19   From: [place], [place];  retired from Patent examiner in 2013 and moved to be near [employer] family   Living: with Mother, sister and niece    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    Social Drivers of Corporate investment banker Strain: Not on file  Food Insecurity: Not on file  Transportation Needs: Not on file  Physical Activity: Not on file  Stress: Not on file  Social Connections: Not on file  Intimate Partner Violence: Not on file     Outpatient Medications Prior to Visit  Medication Sig Dispense Refill   ASPIRIN 81 PO Take by mouth.     Multiple Vitamin (MULTIVITAMIN) capsule Take 1 capsule by mouth daily.     atorvastatin (LIPITOR) 10 MG tablet Take 1 tablet (10 mg total) by mouth daily. (Patient not taking: Reported on 12/27/2021) 90 tablet 3   celecoxib (CELEBREX) 200 MG capsule Take 1 capsule (200 mg total) by mouth daily. 30 capsule 2   lisinopril (ZESTRIL) 20 MG tablet Take 1 tablet (20 mg total) by mouth daily. (Patient not taking: Reported on 12/27/2021) 90 tablet 0   Multiple Vitamins-Minerals (CENTRUM SILVER PO) Take by mouth.     No facility-administered medications prior to visit.    No Known Allergies   ROS: Pertinent symptoms negative unless otherwise noted in HPI      Objective:    Physical Exam Vitals reviewed.  Constitutional:      General: She is not in acute distress.    Appearance: Normal appearance. She is not ill-appearing or toxic-appearing.  HENT:     Right Ear: Tympanic membrane normal.     Left Ear: Tympanic membrane normal.     Mouth/Throat:     Mouth: Mucous membranes are moist.     Pharynx: No pharyngeal swelling.     Tonsils: No tonsillar exudate.  Eyes:     Extraocular Movements: Extraocular movements intact.     Conjunctiva/sclera: Conjunctivae normal.     Pupils: Pupils are equal, round, and reactive to light.  Neck:     Thyroid: No thyroid mass.  Cardiovascular:     Rate and Rhythm: Normal rate and regular rhythm.  Pulmonary:     Effort: Pulmonary effort is normal.     Breath sounds: Normal breath sounds.  Abdominal:     General: Abdomen is flat. Bowel sounds are normal.     Palpations: Abdomen is soft.  Musculoskeletal:        General: Normal range of motion.     Cervical back: No rigidity. Pain with movement and muscular tenderness present. Normal range of motion.  Lymphadenopathy:     Cervical:     Right cervical: No superficial cervical adenopathy.     Left cervical: No superficial cervical adenopathy.  Skin:    General: Skin is warm.     Capillary Refill: Capillary refill takes less than 2 seconds.  Neurological:  General: No focal deficit present.     Mental Status: She is alert and oriented to person, place, and time.  Psychiatric:        Mood and Affect: Mood normal.        Behavior: Behavior normal.        Thought Content: Thought content normal.        Judgment: Judgment normal.       BP (!) 172/98   Pulse 88   Temp 97.8 F (36.6 C) (Temporal)   Ht 5' 8.75" (1.746 m)   Wt (!) 301 lb 12.8 oz (136.9 kg)   SpO2 97%   BMI 44.89 kg/m  Wt Readings from Last 3 Encounters:  01/13/23 (!) 301 lb 12.8 oz (136.9 kg)  12/27/21 (!) 304 lb 8 oz (138.1 kg)  05/10/20 (!) 310 lb (140.6 kg)     Health Maintenance Due  Topic Date Due   DTaP/Tdap/Td (1 - Tdap) Never done   Cervical Cancer Screening (HPV/Pap Cotest)  Never done   Colonoscopy  Never done   MAMMOGRAM  Never done   Zoster Vaccines- Shingrix (1 of 2) Never done   COVID-19 Vaccine (4 - 2024-25 season) 09/25/2022    There are no preventive care reminders to display for this patient.  Lab Results  Component Value Date   TSH 2.70 01/13/2023   Lab Results  Component Value Date   WBC 6.1 01/13/2023   HGB 13.2 01/13/2023   HCT 40.0 01/13/2023   MCV 85.0 01/13/2023   PLT 251.0 01/13/2023   Lab Results  Component Value Date   NA 140 01/13/2023   K 4.2 01/13/2023   CO2 30 01/13/2023   GLUCOSE 109 (H) 01/13/2023   BUN 16 01/13/2023   CREATININE 1.02 01/13/2023   BILITOT 0.4 01/13/2023   ALKPHOS 58 01/13/2023   AST 16 01/13/2023   ALT 18 01/13/2023   PROT 6.8 01/13/2023   ALBUMIN 4.1 01/13/2023   CALCIUM 9.1 01/13/2023   ANIONGAP 6 05/15/2021   GFR 58.63 (L) 01/13/2023   Lab Results  Component Value Date   CHOL 258 (H) 01/13/2023   Lab Results  Component Value Date   HDL 52.40 01/13/2023   Lab Results  Component Value Date   LDLCALC 181 (H)  01/13/2023   Lab Results  Component Value Date   TRIG 122.0 01/13/2023   Lab Results  Component Value Date   CHOLHDL 5 01/13/2023   Lab Results  Component Value Date   HGBA1C 5.5 01/13/2023      Assessment & Plan:   Screening for colon cancer -     Ambulatory referral to Gastroenterology  Screening mammogram for breast cancer -     3D Screening Mammogram, Left and Right; Future  Primary hypertension Assessment & Plan: Start amlodipine 5 mg once daily.  Pt advised of the following:  Continue medication as prescribed. Monitor blood pressure periodically and/or when you feel symptomatic. Goal is <130/90 on average. Ensure that you have rested for 30 minutes prior to checking your blood pressure. Record your readings and bring them to your next visit if necessary.work on a low sodium diet.   Orders: -     Blood Pressure Cuff; 1 each by Does not apply route daily.  Dispense: 1 each; Refill: 0 -     amLODIPine Besylate; Take 1 tablet (5 mg total) by mouth daily.  Dispense: 90 tablet; Refill: 0 -     Microalbumin / creatinine urine ratio  S/P laparoscopic  supracervical hysterectomy -     Ambulatory referral to Gynecology  Mixed hyperlipidemia Assessment & Plan: Ordered lipid panel, pending results. Work on low cholesterol diet and exercise as tolerated   Orders: -     Lipid panel -     Lipoprotein A (LPA)  Elevated LFTs -     Comprehensive metabolic panel  Morbid obesity with BMI of 45.0-49.9, adult [facility]) Assessment & Plan: The beneficiary does not have any FDA labeled contraindications to the requested agent including pregnancy, lactation, h/o medullary thyroid cancer or multiple endocrine neoplasia type II.   Pt advised to work on diet and exercise as tolerated   Orders: -     Hemoglobin A1c -     TSH  Encounter for general adult medical examination with abnormal findings Assessment & Plan: Patient Counseling(The following topics were reviewed):   Preventative care handout given to pt  Health maintenance and immunizations reviewed. Please refer to Health maintenance section. Pt advised on safe sex, wearing seatbelts in car, and proper nutrition labwork ordered today for annual Dental health: Discussed importance of regular tooth brushing, flossing, and dental visits.   Orders: -     CBC -     Comprehensive metabolic panel -     Lipid panel -     Lipoprotein A (LPA) -     Hemoglobin A1c -     TSH  Muscle spasm Assessment & Plan: Tightness.  Rx flexeril prn  Tylenol/ibuprofen/lidocaine Work on neck exercises.   Additional time spent on acute concerns approximately 15 minutes   Orders: -     Cyclobenzaprine HCl; Take 1/2 to 1 tablet po at bedtime prn muscle spasm  Dispense: 30 tablet; Refill: 0  Post-menopausal -     Ambulatory referral to Gynecology    Meds ordered this encounter  Medications   Blood Pressure Monitoring (BLOOD PRESSURE CUFF) MISC    Sig: 1 each by Does not apply route daily.    Dispense:  1 each    Refill:  0    Supervising Provider:   BEDSOLE, AMY E [[id number]]   amLODipine (NORVASC) 5 MG tablet    Sig: Take 1 tablet (5 mg total) by mouth daily.    Dispense:  90 tablet    Refill:  0    Supervising Provider:   BEDSOLE, AMY E [[id number]]   cyclobenzaprine (FLEXERIL) 10 MG tablet    Sig: Take 1/2 to 1 tablet po at bedtime prn muscle spasm    Dispense:  30 tablet    Refill:  0    Supervising Provider:   Ermalene Searing, AMY E [[id number]]    Follow-up: Return in about 2 weeks (around 01/27/2023) for f/u blood pressure.    Mort Sawyers, FNP

## 2023-01-16 ENCOUNTER — Encounter: Payer: Self-pay | Admitting: Family

## 2023-01-16 DIAGNOSIS — E782 Mixed hyperlipidemia: Secondary | ICD-10-CM

## 2023-01-18 DIAGNOSIS — Z90711 Acquired absence of uterus with remaining cervical stump: Secondary | ICD-10-CM | POA: Insufficient documentation

## 2023-01-18 DIAGNOSIS — M62838 Other muscle spasm: Secondary | ICD-10-CM | POA: Insufficient documentation

## 2023-01-18 DIAGNOSIS — Z0001 Encounter for general adult medical examination with abnormal findings: Secondary | ICD-10-CM | POA: Insufficient documentation

## 2023-01-18 MED ORDER — ATORVASTATIN CALCIUM 10 MG PO TABS
10.0000 mg | ORAL_TABLET | Freq: Every day | ORAL | 3 refills | Status: AC
Start: 2023-01-18 — End: ?

## 2023-01-18 NOTE — Assessment & Plan Note (Signed)

## 2023-01-18 NOTE — Assessment & Plan Note (Addendum)
Tightness.  Rx flexeril prn  Tylenol/ibuprofen/lidocaine Work on neck exercises.   Additional time spent on acute concerns approximately 15 minutes

## 2023-01-18 NOTE — Assessment & Plan Note (Signed)
Ordered lipid panel, pending results. Work on low cholesterol diet and exercise as tolerated  

## 2023-01-21 LAB — LIPOPROTEIN A (LPA): Lipoprotein (a): 199 nmol/L — ABNORMAL HIGH (ref <75)

## 2023-01-23 ENCOUNTER — Encounter: Payer: Self-pay | Admitting: Family

## 2023-01-26 ENCOUNTER — Ambulatory Visit: Payer: 59 | Admitting: Family

## 2023-01-26 ENCOUNTER — Telehealth: Payer: Self-pay

## 2023-01-26 ENCOUNTER — Encounter: Payer: Self-pay | Admitting: Family

## 2023-01-26 VITALS — BP 160/80 | HR 112 | Temp 99.0°F | Ht 68.75 in | Wt 301.2 lb

## 2023-01-26 DIAGNOSIS — I10 Essential (primary) hypertension: Secondary | ICD-10-CM

## 2023-01-26 DIAGNOSIS — E7841 Elevated Lipoprotein(a): Secondary | ICD-10-CM | POA: Diagnosis not present

## 2023-01-26 NOTE — Assessment & Plan Note (Signed)
 Improving.  Advised to monitor daily with blood pressure cuff, advised on proper use.  Did let pt know she can make a nurse only appt if she would like further instruction.  Cont amlodipine  5 mg once daily will increase if goal reported back in one week is >130/90  Educated on low salt diet/dash

## 2023-01-26 NOTE — Progress Notes (Signed)
 Established Patient Office Visit  Subjective:      CC:  Chief Complaint  Patient presents with   Medical Management of Chronic Issues    HPI: Marissa Roman is a 63 y.o. female presenting on 01/26/2023 for Medical Management of Chronic Issues  HTN: started with amlodipine  5 mg once daily. She has been unable to check her blood pressure at home due to inappropriate cough. Denies cp palp and or sob. She is trying to work on and fix her diet for dash/low cholesterol.   HLD: has been working on a low cholesterol diet as best that she could over the holidays. Has started at the gym and trying to work up with an exercise routine. Has cut out alcohol. Has started lipitor 10 mg nightly and is tolerating well.   Low GFR, has since increased her water intake. Has drank about 64 ounces a day.      Social history:  Relevant past medical, surgical, family and social history reviewed and updated as indicated. Interim medical history since our last visit reviewed.  Allergies and medications reviewed and updated.  DATA REVIEWED: CHART IN EPIC     ROS: Negative unless specifically indicated above in HPI.    Current Outpatient Medications:    amLODipine  (NORVASC ) 5 MG tablet, Take 1 tablet (5 mg total) by mouth daily., Disp: 90 tablet, Rfl: 0   ASPIRIN  81 PO, Take by mouth., Disp: , Rfl:    atorvastatin  (LIPITOR) 10 MG tablet, Take 1 tablet (10 mg total) by mouth daily., Disp: 90 tablet, Rfl: 3   Blood Pressure Monitoring (BLOOD PRESSURE CUFF) MISC, 1 each by Does not apply route daily., Disp: 1 each, Rfl: 0   Multiple Vitamin (MULTIVITAMIN) capsule, Take 1 capsule by mouth daily., Disp: , Rfl:       Objective:    BP (!) 160/80   Pulse (!) 112   Temp 99 F (37.2 C) (Temporal)   Ht 5' 8.75 (1.746 m)   Wt (!) 301 lb 3.4 oz (136.6 kg)   SpO2 96%   BMI 44.81 kg/m   Wt Readings from Last 3 Encounters:  01/26/23 (!) 301 lb 3.4 oz (136.6 kg)  01/13/23 (!) 301 lb 12.8 oz (136.9 kg)   12/27/21 (!) 304 lb 8 oz (138.1 kg)    Physical Exam Constitutional:      General: She is not in acute distress.    Appearance: Normal appearance. She is obese. She is not ill-appearing, toxic-appearing or diaphoretic.  HENT:     Head: Normocephalic.  Cardiovascular:     Rate and Rhythm: Normal rate and regular rhythm.  Pulmonary:     Effort: Pulmonary effort is normal.  Musculoskeletal:        General: Normal range of motion.  Neurological:     General: No focal deficit present.     Mental Status: She is alert and oriented to person, place, and time. Mental status is at baseline.  Psychiatric:        Mood and Affect: Mood normal.        Behavior: Behavior normal.        Thought Content: Thought content normal.        Judgment: Judgment normal.           Assessment & Plan:  Elevated lipoprotein A level Assessment & Plan: Goal LDL < 55  Pt advised to:   on low cholesterol diet and exercise as tolerated Continue atorvastatin  10 mg nightly  Primary hypertension Assessment & Plan: Improving.  Advised to monitor daily with blood pressure cuff, advised on proper use.  Did let pt know she can make a nurse only appt if she would like further instruction.  Cont amlodipine  5 mg once daily will increase if goal reported back in one week is >130/90  Educated on low salt diet/dash      Return in about 3 months (around 04/26/2023) for f/u cholesterol, f/u blood pressure.  Ginger Patrick, MSN, APRN, FNP-C [facility]

## 2023-01-26 NOTE — Telephone Encounter (Signed)
 Per pt would like to r/s procedure. Please call [phone]

## 2023-01-26 NOTE — Assessment & Plan Note (Signed)
 Goal LDL < 55  Pt advised to:   on low cholesterol diet and exercise as tolerated Continue atorvastatin 10 mg nightly

## 2023-01-27 NOTE — Telephone Encounter (Signed)
 Patients colonoscopy has been rescheduled from 02/20/23 to 02/23/23.  Referral updated.  Instructions updated.  Endo notified.  Thanks,  [place], [place]

## 2023-01-31 ENCOUNTER — Telehealth: Payer: Self-pay

## 2023-01-31 NOTE — Telephone Encounter (Signed)
 Pt called requesting call back to reschedule upcoming procedure because of conflict with another procedure

## 2023-02-01 NOTE — Telephone Encounter (Signed)
 Patients call has been returned.  She has requested to reschedule due to her mother who is bringing her has a dental appt scheduled on the same day.  Colonoscopy has been rescheduled from 01/30 to 02/13 still with Dr. Unk at [facility].  Niels in Endo notified.  Referral updated. Instructions updated.  Thanks, [place], CMA

## 2023-02-09 MED ORDER — AMLODIPINE BESYLATE 10 MG PO TABS: 10.0000 mg | ORAL_TABLET | Freq: Every day | ORAL | 3 refills | Status: DC

## 2023-03-08 ENCOUNTER — Ambulatory Visit (INDEPENDENT_AMBULATORY_CARE_PROVIDER_SITE_OTHER): Payer: 59 | Admitting: Family Medicine

## 2023-03-08 ENCOUNTER — Other Ambulatory Visit (HOSPITAL_COMMUNITY)
Admission: RE | Admit: 2023-03-08 | Discharge: 2023-03-08 | Disposition: A | Discharge status: Home / Self Care | Payer: 59 | Source: Ambulatory Visit | Attending: Family Medicine | Admitting: Family Medicine

## 2023-03-08 ENCOUNTER — Encounter: Payer: Self-pay | Admitting: Family Medicine

## 2023-03-08 VITALS — BP 144/84 | HR 88 | Ht 70.0 in | Wt 296.4 lb

## 2023-03-08 DIAGNOSIS — Z124 Encounter for screening for malignant neoplasm of cervix: Secondary | ICD-10-CM | POA: Insufficient documentation

## 2023-03-08 DIAGNOSIS — Z113 Encounter for screening for infections with a predominantly sexual mode of transmission: Secondary | ICD-10-CM

## 2023-03-08 DIAGNOSIS — Z01419 Encounter for gynecological examination (general) (routine) without abnormal findings: Secondary | ICD-10-CM

## 2023-03-08 NOTE — Progress Notes (Signed)
Subjective:     Marissa Roman is a 63 y.o. female and is here for a comprehensive physical exam. The patient reports no problems. Needs mammogram. Has h/o [facility] and no h/o abnormal pap. No bleeding.   The following portions of the patient's history were reviewed and updated as appropriate: allergies, current medications, past family history, past medical history, past social history, past surgical history, and problem list.  Review of Systems Pertinent items noted in HPI and remainder of comprehensive ROS otherwise negative.   Objective:  Chaperone present for exam   BP (!) 144/84   Pulse 88   Ht 5\' 10"  (1.778 m)   Wt 296 lb 6.4 oz (134.4 kg)   BMI 42.53 kg/m  General appearance: alert, cooperative, appears stated age, and moderately obese Head: Normocephalic, without obvious abnormality, atraumatic Neck: no adenopathy, supple, symmetrical, trachea midline, and thyroid not enlarged, symmetric, no tenderness/mass/nodules Lungs: clear to auscultation bilaterally Breasts: normal appearance, no masses or tenderness Heart: regular rate and rhythm, S1, S2 normal, no murmur, click, rub or gallop Abdomen: soft, non-tender; bowel sounds normal; no masses,  no organomegaly Pelvic: cervix normal in appearance, external genitalia normal, uterus surgically absent, and vagina normal without discharge Extremities: edema 2+ Pulses: 2+ and symmetric Skin: Skin color, texture, turgor normal. No rashes or lesions Lymph nodes: Cervical, supraclavicular, and axillary nodes normal. Neurologic: Grossly normal    Assessment:    GYN female exam.      Plan:  Screening for malignant neoplasm of cervix - Plan: Cytology - PAP( [place])  Encounter for gynecological examination without abnormal finding  Screen for STD (sexually transmitted disease) - Plan: Hepatitis B surface antigen, Hepatitis C antibody, HIV Antibody (routine testing w rflx), RPR, Cytology - PAP( [place])    See After Visit  Summary for Counseling Recommendations

## 2023-03-08 NOTE — Progress Notes (Signed)
Patient presents for Annual.  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

## 2023-03-09 ENCOUNTER — Other Ambulatory Visit: Payer: Self-pay

## 2023-03-09 ENCOUNTER — Encounter
Admission: RE | Disposition: A | Discharge status: Home / Self Care | Payer: Self-pay | Source: Home / Self Care | Attending: Gastroenterology

## 2023-03-09 ENCOUNTER — Ambulatory Visit: Payer: 59 | Admitting: Anesthesiology

## 2023-03-09 ENCOUNTER — Encounter: Payer: Self-pay | Admitting: Gastroenterology

## 2023-03-09 ENCOUNTER — Ambulatory Visit
Admission: RE | Admit: 2023-03-09 | Discharge: 2023-03-09 | Disposition: A | Discharge status: Home / Self Care | Payer: 59 | Attending: Gastroenterology | Admitting: Gastroenterology

## 2023-03-09 DIAGNOSIS — E66813 Obesity, class 3: Secondary | ICD-10-CM | POA: Insufficient documentation

## 2023-03-09 DIAGNOSIS — Z6841 Body Mass Index (BMI) 40.0 and over, adult: Secondary | ICD-10-CM | POA: Diagnosis not present

## 2023-03-09 DIAGNOSIS — Z1211 Encounter for screening for malignant neoplasm of colon: Secondary | ICD-10-CM | POA: Diagnosis present

## 2023-03-09 DIAGNOSIS — I10 Essential (primary) hypertension: Secondary | ICD-10-CM | POA: Diagnosis not present

## 2023-03-09 DIAGNOSIS — K621 Rectal polyp: Secondary | ICD-10-CM | POA: Diagnosis not present

## 2023-03-09 HISTORY — PX: POLYPECTOMY: SHX5525

## 2023-03-09 HISTORY — PX: COLONOSCOPY WITH PROPOFOL: SHX5780

## 2023-03-09 LAB — RPR: RPR Ser Ql: NONREACTIVE

## 2023-03-09 LAB — HEPATITIS C ANTIBODY: Hep C Virus Ab: NONREACTIVE

## 2023-03-09 LAB — HIV ANTIBODY (ROUTINE TESTING W REFLEX): HIV Screen 4th Generation wRfx: NONREACTIVE

## 2023-03-09 LAB — HEPATITIS B SURFACE ANTIGEN: Hepatitis B Surface Ag: NEGATIVE

## 2023-03-09 SURGERY — COLONOSCOPY WITH PROPOFOL
Anesthesia: General

## 2023-03-09 MED ORDER — SODIUM CHLORIDE 0.9 % IV SOLN: INTRAVENOUS | Status: DC

## 2023-03-09 MED ORDER — PROPOFOL 500 MG/50ML IV EMUL
INTRAVENOUS | Status: DC | PRN
  Administered 2023-03-09: 140 ug/kg/min via INTRAVENOUS

## 2023-03-09 MED ORDER — PHENYLEPHRINE HCL (PRESSORS) 10 MG/ML IV SOLN
INTRAVENOUS | Status: DC | PRN
  Administered 2023-03-09 (×2): 80 ug via INTRAVENOUS

## 2023-03-09 MED ORDER — PROPOFOL 10 MG/ML IV BOLUS
INTRAVENOUS | Status: DC | PRN
  Administered 2023-03-09: 60 mg via INTRAVENOUS
  Administered 2023-03-09 (×2): 20 mg via INTRAVENOUS

## 2023-03-09 NOTE — Transfer of Care (Signed)
Immediate Anesthesia Transfer of Care Note  Patient: Marissa Roman  Procedure(s) Performed: COLONOSCOPY WITH PROPOFOL  Patient Location: PACU  Anesthesia Type:General  Level of Consciousness: awake, alert , and oriented  Airway & Oxygen Therapy: Patient Spontanous Breathing  Post-op Assessment: Report given to RN and Post -op Vital signs reviewed and stable  Post vital signs: Reviewed and stable  Last Vitals:  Vitals Value Taken Time  BP 101/61 03/09/23 0827  Temp 36.1 C 03/09/23 0823  Pulse 77 03/09/23 0828  Resp 18 03/09/23 0823  SpO2 91 % 03/09/23 0828  Vitals shown include unfiled device data.  Last Pain:  Vitals:   03/09/23 0823  TempSrc: Temporal  PainSc: 0-No pain         Complications: No notable events documented.

## 2023-03-09 NOTE — H&P (Signed)
Arlyss Repress, MD [address]  [address]  [place], [place] [address]  Main: [phone]  Fax: [phone] Pager: [phone]  Primary Care Physician:  Mort Sawyers, FNP Primary Gastroenterologist:  Dr. Arlyss Repress  Pre-Procedure History & Physical: HPI:  Marissa Roman is a 63 y.o. female is here for an colonoscopy.   Past Medical History:  Diagnosis Date   Hyperlipidemia    Hypertension     Past Surgical History:  Procedure Laterality Date   ARTHROSCOPIC REPAIR ACL Left    LAPAROSCOPIC SUPRACERVICAL HYSTERECTOMY  2005   REPLACEMENT TOTAL KNEE Left     Prior to Admission medications   Medication Sig Start Date End Date Taking? Authorizing Provider  amLODipine (NORVASC) 10 MG tablet Take 1 tablet (10 mg total) by mouth daily. 02/09/23  Yes Dugal, Wyatt Mage, FNP  ASPIRIN 81 PO Take by mouth.   Yes [provider]  atorvastatin (LIPITOR) 10 MG tablet Take 1 tablet (10 mg total) by mouth daily. 01/18/23  Yes Dugal, Wyatt Mage, FNP  Blood Pressure Monitoring (BLOOD PRESSURE CUFF) MISC 1 each by Does not apply route daily. 01/13/23   Mort Sawyers, FNP  Multiple Vitamin (MULTIVITAMIN) capsule Take 1 capsule by mouth daily.    [provider]    Allergies as of 01/13/2023   (No Known Allergies)    Family History  Problem Relation Age of Onset   Hypertension Mother    Lung cancer Father    Pancreatic cancer Maternal Aunt    Breast cancer Maternal Aunt     Social History   Socioeconomic History   Marital status: Single    Spouse name: Not on file   Number of children: 0   Years of education: college   Highest education level: Master's degree (e.g., MA, MS, MEng, MEd, MSW, MBA)  Occupational History    Comment: [employer], Corporate treasurer  Tobacco Use   Smoking status: Never   Smokeless tobacco: Never  Vaping Use   Vaping status: Never Used  Substance and Sexual Activity   Alcohol use: Not Currently    Comment: beer  and bourbon-3 times a week   Drug use: Not Currently    Types: Marijuana   Sexual activity: Not Currently  Other Topics Concern   Not on file  Social History Narrative   05/29/19   From: [place], [place]; retired from Patent examiner in 2013 and moved to be near [employer] family   Living: with Mother, sister and niece    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    Social Drivers of Corporate investment banker Strain: Low Risk  (01/26/2023)   Overall Financial Resource Strain (CARDIA)    Difficulty of Paying Living Expenses: Not very hard  Food Insecurity: No Food Insecurity (01/26/2023)   Hunger Vital Sign    Worried About Running Out of Food in the Last  Year: Never true    Ran Out of Food in the Last Year: Never true  Transportation Needs: No Transportation Needs (01/26/2023)   PRAPARE - Administrator, Civil Service (Medical): No    Lack of Transportation (Non-Medical): No  Physical Activity: Insufficiently Active (01/26/2023)   Exercise Vital Sign    Days of Exercise per Week: 2 days    Minutes of Exercise per Session: 60 min  Stress: Stress Concern Present (01/26/2023)   [employer] of Occupational Health - Occupational Stress Questionnaire    Feeling of Stress : To some extent  Social Connections: Moderately Integrated (01/26/2023)   Social Connection and Isolation Panel [NHANES]    Frequency of Communication with Friends and Family: More than three times a week    Frequency of Social Gatherings with Friends and Family: More than three times a week     Attends Religious Services: More than 4 times per year    Active Member of [employer] or Organizations: Yes    Attends Engineer, structural: More than 4 times per year    Marital Status: Never married  Catering manager Violence: Not on file    Review of Systems: See HPI, otherwise negative ROS  Physical Exam: BP 134/72   Pulse 83   Temp (!) 97.1 F (36.2 C) (Temporal)   Resp 18   Ht 5\' 10"  (1.778 m)   Wt 135.3 kg   SpO2 95%   BMI 42.79 kg/m  General:   Alert,  pleasant and cooperative in NAD Head:  Normocephalic and atraumatic. Neck:  Supple; no masses or thyromegaly. Lungs:  Clear throughout to auscultation.    Heart:  Regular rate and rhythm. Abdomen:  Soft, nontender and nondistended. Normal bowel sounds, without guarding, and without rebound.   Neurologic:  Alert and  oriented x4;  grossly normal neurologically.  Impression/Plan: Marissa Roman is here for an colonoscopy to be performed for colon cancer screening  Risks, benefits, limitations, and alternatives regarding  colonoscopy have been reviewed with the patient.  Questions have been answered.  All parties agreeable.   Lannette Donath, MD  03/09/2023, 7:55 AM

## 2023-03-09 NOTE — Op Note (Signed)
[facility] Patient Name: Marissa Roman Procedure Date: 03/09/2023 7:59 AM MRN: [id number] Account #: [id number] Date of Birth: [birth year] Admit Type: Outpatient Age: 63 Room: [facility] ENDO ROOM 3 Gender: Female Note Status: Finalized Instrument Name: Nelda Marseille [id number] Procedure:             Colonoscopy Indications:           Screening for colorectal malignant neoplasm, This is                         the patient's first colonoscopy Providers:             Toney Reil MD, MD Referring MD:          Mort Sawyers (Referring MD) Medicines:             General Anesthesia Complications:         No immediate complications. Estimated blood loss: None. Procedure:             Pre-Anesthesia Assessment:                        - Prior to the procedure, a History and Physical was                         performed, and patient medications and allergies were                         reviewed. The patient is competent. The risks and                         benefits of the procedure and the sedation options and                         risks were discussed with the patient. All questions                         were answered and informed consent was obtained.                         Patient identification and proposed procedure were                         verified by the physician, the nurse, the                         anesthesiologist, the anesthetist and the technician                         in the pre-procedure area in the procedure room in the                         endoscopy suite. Mental Status Examination: alert and                         oriented. Airway Examination: normal oropharyngeal                         airway and neck mobility. Respiratory Examination:  clear to auscultation. CV Examination: normal.                         Prophylactic Antibiotics: The patient does not require                         prophylactic  antibiotics. Prior Anticoagulants: The                         patient has taken no anticoagulant or antiplatelet                         agents. ASA Grade Assessment: III - A patient with                         severe systemic disease. After reviewing the risks and                         benefits, the patient was deemed in satisfactory                         condition to undergo the procedure. The anesthesia                         plan was to use general anesthesia. Immediately prior                         to administration of medications, the patient was                         re-assessed for adequacy to receive sedatives. The                         heart rate, respiratory rate, oxygen saturations,                         blood pressure, adequacy of pulmonary ventilation, and                         response to care were monitored throughout the                         procedure. The physical status of the patient was                         re-assessed after the procedure.                        After obtaining informed consent, the colonoscope was                         passed under direct vision. Throughout the procedure,                         the patient's blood pressure, pulse, and oxygen                         saturations were monitored continuously. The  Colonoscope was introduced through the anus and                         advanced to the the cecum, identified by appendiceal                         orifice and ileocecal valve. The colonoscopy was                         performed without difficulty. The patient tolerated                         the procedure well. The quality of the bowel                         preparation was evaluated using the BBPS [facility] Bowel                         Preparation Scale) with scores of: Right Colon = 3,                         Transverse Colon = 3 and Left Colon = 3 (entire mucosa                         seen  well with no residual staining, small fragments                         of stool or opaque liquid). The total BBPS score                         equals 9. The ileocecal valve, appendiceal orifice,                         and rectum were photographed. Findings:      The perianal and digital rectal examinations were normal. Pertinent       negatives include normal sphincter tone and no palpable rectal lesions.      A 5 mm polyp was found in the rectum. The polyp was sessile. The polyp       was removed with a cold snare. Resection and retrieval were complete.      The retroflexed view of the distal rectum and anal verge was normal and       showed no anal or rectal abnormalities. Impression:            - One 5 mm polyp in the rectum, removed with a cold                         snare. Resected and retrieved.                        - The distal rectum and anal verge are normal on                         retroflexion view. Recommendation:        - Discharge patient to home (with escort).                        -  Resume previous diet today.                        - Continue present medications.                        - Await pathology results.                        - Repeat colonoscopy in 5-10 years for surveillance                         based on pathology results. Procedure Code(s):     --- Professional ---                        [phone], Colonoscopy, flexible; with removal of                         tumor(s), polyp(s), or other lesion(s) by snare                         technique Diagnosis Code(s):     --- Professional ---                        Z12.11, Encounter for screening for malignant neoplasm                         of colon                        D12.8, Benign neoplasm of rectum CPT copyright 2022 American Medical Association. All rights reserved. The codes documented in this report are preliminary and upon coder review may  be revised to meet current compliance requirements. Dr.  Libby Maw Toney Reil MD, MD 03/09/2023 8:23:32 AM This report has been signed electronically. Number of Addenda: 0 Note Initiated On: 03/09/2023 7:59 AM Scope Withdrawal Time: 0 hours 9 minutes 25 seconds  Total Procedure Duration: 0 hours 14 minutes 30 seconds  Estimated Blood Loss:  Estimated blood loss: none.      [facility]

## 2023-03-09 NOTE — Anesthesia Preprocedure Evaluation (Addendum)
Anesthesia Evaluation  Patient identified by MRN, date of birth, ID band Patient awake    Reviewed: Allergy & Precautions, H&P , NPO status , Patient's Chart, lab work & pertinent test results  Airway Mallampati: III  TM Distance: >3 FB Neck ROM: full    Dental  (+) Missing   Pulmonary neg pulmonary ROS   Pulmonary exam normal        Cardiovascular hypertension, Normal cardiovascular exam     Neuro/Psych negative neurological ROS  negative psych ROS   GI/Hepatic negative GI ROS, Neg liver ROS,,,  Endo/Other    Class 3 obesity  Renal/GU negative Renal ROS  negative genitourinary   Musculoskeletal   Abdominal  (+) + obese  Peds  Hematology negative hematology ROS (+)   Anesthesia Other Findings Past Medical History: No date: Hyperlipidemia No date: Hypertension  Past Surgical History: No date: ARTHROSCOPIC REPAIR ACL; Left 2005: LAPAROSCOPIC SUPRACERVICAL HYSTERECTOMY No date: REPLACEMENT TOTAL KNEE; Left     Reproductive/Obstetrics negative OB ROS                             Anesthesia Physical Anesthesia Plan  ASA: 3  Anesthesia Plan: General   Post-op Pain Management:    Induction:   PONV Risk Score and Plan: Propofol infusion and TIVA  Airway Management Planned:   Additional Equipment:   Intra-op Plan:   Post-operative Plan:   Informed Consent: I have reviewed the patients History and Physical, chart, labs and discussed the procedure including the risks, benefits and alternatives for the proposed anesthesia with the patient or authorized representative who has indicated his/her understanding and acceptance.     Dental Advisory Given  Plan Discussed with: CRNA and Surgeon  Anesthesia Plan Comments:         Anesthesia Quick Evaluation

## 2023-03-09 NOTE — Anesthesia Postprocedure Evaluation (Signed)
Anesthesia Post Note  Patient: Marissa Roman  Procedure(s) Performed: COLONOSCOPY WITH PROPOFOL POLYPECTOMY  Patient location during evaluation: Endoscopy Anesthesia Type: General Level of consciousness: awake and alert Pain management: pain level controlled Vital Signs Assessment: post-procedure vital signs reviewed and stable Respiratory status: spontaneous breathing, nonlabored ventilation and respiratory function stable Cardiovascular status: blood pressure returned to baseline and stable Postop Assessment: no apparent nausea or vomiting Anesthetic complications: no   No notable events documented.   Last Vitals:  Vitals:   03/09/23 0833 03/09/23 0843  BP: 115/71 121/73  Pulse:  71  Resp: 18 18  Temp:    SpO2: 98% 99%    Last Pain:  Vitals:   03/09/23 0843  TempSrc:   PainSc: 0-No pain                 [name]

## 2023-03-10 ENCOUNTER — Encounter: Payer: Self-pay | Admitting: Gastroenterology

## 2023-03-10 LAB — CYTOLOGY - PAP
Chlamydia: NEGATIVE
Comment: NEGATIVE
Comment: NEGATIVE
Comment: NEGATIVE
Comment: NORMAL
Diagnosis: NEGATIVE
High risk HPV: NEGATIVE
Neisseria Gonorrhea: NEGATIVE
Trichomonas: NEGATIVE

## 2023-03-10 LAB — SURGICAL PATHOLOGY

## 2023-03-13 ENCOUNTER — Encounter: Payer: Self-pay | Admitting: Family Medicine

## 2023-03-13 NOTE — Progress Notes (Signed)
 noted

## 2023-03-14 ENCOUNTER — Other Ambulatory Visit: Payer: Self-pay | Admitting: Family

## 2023-03-14 ENCOUNTER — Telehealth: Payer: Self-pay | Admitting: Orthopedic Surgery

## 2023-03-14 DIAGNOSIS — I10 Essential (primary) hypertension: Secondary | ICD-10-CM

## 2023-03-14 NOTE — Telephone Encounter (Signed)
This patient scheduled an appointment online, I called to get more details and found out she has seen another Ortho dr and had x-rays in the last 2-3 years.  She kept telling me she didn't remember who she saw.  I explained that we need to x-rays, reports and notes for Dr. Romeo Apple to review before we schedule.  She told me to cancel her appointment.

## 2023-03-20 ENCOUNTER — Ambulatory Visit: Payer: 59 | Admitting: Orthopedic Surgery

## 2023-03-22 DIAGNOSIS — M1711 Unilateral primary osteoarthritis, right knee: Principal | ICD-10-CM | POA: Insufficient documentation

## 2023-03-22 DIAGNOSIS — M1712 Unilateral primary osteoarthritis, left knee: Secondary | ICD-10-CM | POA: Insufficient documentation

## 2023-03-23 ENCOUNTER — Ambulatory Visit
Admission: RE | Admit: 2023-03-23 | Discharge: 2023-03-23 | Disposition: A | Discharge status: Home / Self Care | Payer: 59 | Source: Ambulatory Visit | Attending: Family | Admitting: Family

## 2023-03-23 ENCOUNTER — Ambulatory Visit: Payer: 59 | Admitting: Orthopedic Surgery

## 2023-03-23 DIAGNOSIS — Z1231 Encounter for screening mammogram for malignant neoplasm of breast: Secondary | ICD-10-CM | POA: Diagnosis present

## 2023-03-28 ENCOUNTER — Encounter: Payer: Self-pay | Admitting: Family

## 2023-03-28 ENCOUNTER — Inpatient Hospital Stay
Admission: RE | Admit: 2023-03-28 | Discharge: 2023-03-28 | Disposition: A | Discharge status: Home / Self Care | Payer: Self-pay | Source: Ambulatory Visit | Attending: Family | Admitting: Family

## 2023-03-28 ENCOUNTER — Other Ambulatory Visit: Payer: Self-pay | Admitting: *Deleted

## 2023-03-28 DIAGNOSIS — Z1231 Encounter for screening mammogram for malignant neoplasm of breast: Secondary | ICD-10-CM

## 2023-04-26 ENCOUNTER — Ambulatory Visit: Payer: 59 | Admitting: Family

## 2023-05-29 ENCOUNTER — Ambulatory Visit: Admitting: Family

## 2023-06-15 ENCOUNTER — Ambulatory Visit: Admitting: Family

## 2023-06-25 DIAGNOSIS — M1990 Unspecified osteoarthritis, unspecified site: Secondary | ICD-10-CM

## 2023-06-25 HISTORY — DX: Unspecified osteoarthritis, unspecified site: M19.90

## 2023-08-07 ENCOUNTER — Ambulatory Visit: Admitting: Family

## 2023-08-29 ENCOUNTER — Other Ambulatory Visit: Payer: Self-pay | Admitting: Family

## 2023-08-29 DIAGNOSIS — M62838 Other muscle spasm: Secondary | ICD-10-CM

## 2023-09-21 ENCOUNTER — Ambulatory Visit: Admitting: Family

## 2023-09-29 NOTE — Discharge Instructions (Signed)
 Instructions after Total Knee Replacement   Marissa Roman, Jr., M.D.    Dept. of Orthopaedics & Sports Medicine [facility] [address] [place], [place]  [address]  Phone: [phone]   Fax: [phone]       [email or web address]       DIET: Drink plenty of non-alcoholic fluids. Resume your normal diet. Include foods high in fiber.  ACTIVITY:  You may use crutches or a walker with weight-bearing as tolerated, unless instructed otherwise. You may be weaned off of the walker or crutches by your Physical Therapist.  Do NOT place pillows under the knee. Anything placed under the knee could limit your ability to straighten the knee.   Use the Bone Foam 3 times a day for 30 minutes each session to help straighten the knee. Continue doing gentle exercises. Exercising will reduce the pain and swelling, increase motion, and prevent muscle weakness.   Please continue to use the TED compression stockings for 6 weeks. You may remove the stockings at night, but should reapply them in the morning. Do not drive or operate any equipment until instructed.  WOUND CARE:  The initial dressing (Aquacel) can remain in place for 7 days (see separate instructions). Continue to use the PolarCare or ice packs periodically to reduce pain and swelling. You may bathe or shower after the staples are removed at the first office visit following surgery.  MEDICATIONS: You may resume your regular medications. Please take the pain medication as prescribed on the medication. Do not take pain medication on an empty stomach. Unless instructed otherwise, you should take an enteric-coated aspirin  81 mg. TWICE a day. (This along with elevation will help reduce the possibility of blood clots/phlebitis in your operated leg.) Use a stool softener (such as Senokot-S or Colace) daily and a laxative (such as Miralax  or Dulcolax) as needed to prevent constipation.  Do not drive or drink alcoholic beverages when  taking pain medications.  CALL THE OFFICE FOR: Temperature above 101 degrees Excessive bleeding or drainage on the dressing. Excessive swelling, coldness, or paleness of the toes. Persistent nausea and vomiting.  FOLLOW-UP:  You should have an appointment to return to the office in 10-14 days after surgery. Arrangements have been made for continuation of Physical Therapy (either home therapy or outpatient therapy).     [facility] Department Directory         [email or web address]       [email or web address]          Cardiology  Appointments: [place] - [phone]  Endocrinology  Appointments: [place] [phone] [place] - [phone]  Gastroenterology  Appointments: [place] [phone] [place] - [phone]        General Surgery   Appointments: [facility]  Internal Medicine/Family Medicine  Appointments: [facility] - [phone] [place] - [phone]  Metabolic and Weigh Loss Surgery  Appointments: [facility]        Neurology  Appointments: [place] [phone] [place] - [phone]  Neurosurgery  Appointments: [place]  Obstetrics & Gynecology  Appointments: [place] [phone] [place] - [phone]        Pediatrics  Appointments: [place] [phone] [place] - [phone]  Physiatry  Appointments: [place] [phone]  Physical Therapy  Appointments: [place] - [phone]        Podiatry  Appointments: [place] [phone] [place] - [phone]  Pulmonology  Appointments: [place]  Rheumatology  Appointments: [place] [phone]  [place] Location: [facility]  [address] [place], [place]  [address]  [place] Location: [facility]. [address] [place], [place]  [address]  [place]  Location: [facility] [address] [place], [place]  [address]

## 2023-10-05 ENCOUNTER — Encounter
Admission: RE | Admit: 2023-10-05 | Discharge: 2023-10-05 | Disposition: A | Discharge status: Home / Self Care | Source: Ambulatory Visit | Attending: Orthopedic Surgery | Admitting: Orthopedic Surgery

## 2023-10-05 ENCOUNTER — Other Ambulatory Visit: Payer: Self-pay

## 2023-10-05 VITALS — BP 144/69 | HR 92 | Resp 16 | Ht 70.0 in | Wt 299.6 lb

## 2023-10-05 DIAGNOSIS — Z01818 Encounter for other preprocedural examination: Secondary | ICD-10-CM | POA: Diagnosis present

## 2023-10-05 DIAGNOSIS — R8271 Bacteriuria: Secondary | ICD-10-CM | POA: Insufficient documentation

## 2023-10-05 DIAGNOSIS — G8929 Other chronic pain: Secondary | ICD-10-CM | POA: Diagnosis not present

## 2023-10-05 DIAGNOSIS — R8290 Unspecified abnormal findings in urine: Secondary | ICD-10-CM | POA: Insufficient documentation

## 2023-10-05 DIAGNOSIS — M25561 Pain in right knee: Secondary | ICD-10-CM | POA: Diagnosis not present

## 2023-10-05 DIAGNOSIS — R8281 Pyuria: Secondary | ICD-10-CM | POA: Diagnosis not present

## 2023-10-05 DIAGNOSIS — M1711 Unilateral primary osteoarthritis, right knee: Secondary | ICD-10-CM | POA: Insufficient documentation

## 2023-10-05 DIAGNOSIS — Z01812 Encounter for preprocedural laboratory examination: Secondary | ICD-10-CM

## 2023-10-05 HISTORY — DX: Anemia, unspecified: D64.9

## 2023-10-05 HISTORY — DX: Cardiac murmur, unspecified: R01.1

## 2023-10-05 HISTORY — DX: Anxiety disorder, unspecified: F41.9

## 2023-10-05 LAB — URINALYSIS, ROUTINE W REFLEX MICROSCOPIC
Bilirubin Urine: NEGATIVE
Glucose, UA: NEGATIVE mg/dL
Ketones, ur: NEGATIVE mg/dL
Nitrite: NEGATIVE
Protein, ur: NEGATIVE mg/dL
Specific Gravity, Urine: 1.014 (ref 1.005–1.030)
pH: 5 (ref 5.0–8.0)

## 2023-10-05 LAB — COMPREHENSIVE METABOLIC PANEL WITH GFR
ALT: 20 U/L (ref 0–44)
AST: 19 U/L (ref 15–41)
Albumin: 3.3 g/dL — ABNORMAL LOW (ref 3.5–5.0)
Alkaline Phosphatase: 51 U/L (ref 38–126)
Anion gap: 8 (ref 5–15)
BUN: 14 mg/dL (ref 8–23)
CO2: 28 mmol/L (ref 22–32)
Calcium: 9 mg/dL (ref 8.9–10.3)
Chloride: 107 mmol/L (ref 98–111)
Creatinine, Ser: 0.9 mg/dL (ref 0.44–1.00)
GFR, Estimated: >60 mL/min (ref >60)
Glucose, Bld: 99 mg/dL (ref 70–99)
Potassium: 4 mmol/L (ref 3.5–5.1)
Sodium: 143 mmol/L (ref 135–145)
Total Bilirubin: 0.7 mg/dL (ref 0.0–1.2)
Total Protein: 6.7 g/dL (ref 6.5–8.1)

## 2023-10-05 LAB — CBC
HCT: 38.5 % (ref 36.0–46.0)
Hemoglobin: 12.4 g/dL (ref 12.0–15.0)
MCH: 26.5 pg (ref 26.0–34.0)
MCHC: 32.2 g/dL (ref 30.0–36.0)
MCV: 82.3 fL (ref 80.0–100.0)
Platelets: 274 K/uL (ref 150–400)
RBC: 4.68 MIL/uL (ref 3.87–5.11)
RDW: 14.1 % (ref 11.5–15.5)
WBC: 6.2 K/uL (ref 4.0–10.5)
nRBC: 0 % (ref 0.0–0.2)

## 2023-10-05 LAB — SURGICAL PCR SCREEN
MRSA, PCR: NEGATIVE
Staphylococcus aureus: NEGATIVE

## 2023-10-05 LAB — C-REACTIVE PROTEIN: CRP: 0.8 mg/dL (ref <1.0)

## 2023-10-05 LAB — SEDIMENTATION RATE: Sed Rate: 43 mm/h — ABNORMAL HIGH (ref 0–30)

## 2023-10-05 NOTE — Patient Instructions (Addendum)
 Your procedure is scheduled on: 10/11/23 - Wednesday Report to the Registration Desk on the 1st floor of the [facility]. To find out your arrival time, please call [phone] between 1PM - 3PM on: 10/10/23 - Tuesday If your arrival time is 6:00 am, do not arrive before that time as the [facility] entrance doors do not open until 6:00 am.  REMEMBER: Instructions that are not followed completely may result in serious medical risk, up to and including death; or upon the discretion of your surgeon and anesthesiologist your surgery may need to be rescheduled.  Do not eat food after midnight the night before surgery.  No gum chewing or hard candies.  You may however, drink CLEAR liquids up to 2 hours before you are scheduled to arrive for your surgery. Do not drink anything within 2 hours of your scheduled arrival time.  Clear liquids include: - water  - apple juice without pulp - gatorade (not RED colors) - black coffee or tea (Do NOT add milk or creamers to the coffee or tea) Do NOT drink anything that is not on this list.  In addition, your doctor has ordered for you to drink the provided:  Ensure Pre-Surgery Clear Carbohydrate Drink  Drinking this carbohydrate drink up to two hours before surgery helps to reduce insulin resistance and improve patient outcomes. Please complete drinking 2 hours before scheduled arrival time.  One week prior to surgery: Stop Anti-inflammatories (NSAIDS) such as Advil, Aleve, Ibuprofen, Motrin, Naproxen, Naprosyn and Aspirin based products such as Excedrin, Goody's Powder, BC Powder. You may take Tylenol if needed for pain up until the day of surgery.  Stop ANY OVER THE COUNTER supplements until after surgery : Multiple Vitamin (MULTIVITAMIN)   ON THE DAY OF SURGERY ONLY TAKE THESE MEDICATIONS WITH SIPS OF WATER:  amLODipine  (NORVASC )  atorvastatin  (LIPITOR)    No Alcohol for 24 hours before or after surgery.  No Smoking including e-cigarettes  for 24 hours before surgery.  No chewable tobacco products for at least 6 hours before surgery.  No nicotine patches on the day of surgery.  Do not use any recreational drugs for at least a week (preferably 2 weeks) before your surgery.  Please be advised that the combination of cocaine and anesthesia may have negative outcomes, up to and including death. If you test positive for cocaine, your surgery will be cancelled.  On the morning of surgery brush your teeth with toothpaste and water, you may rinse your mouth with mouthwash if you wish. Do not swallow any toothpaste or mouthwash.  Use CHG Soap or wipes as directed on instruction sheet.  Do not wear jewelry, make-up, hairpins, clips or nail polish.  For welded (permanent) jewelry: bracelets, anklets, waist bands, etc.  Please have this removed prior to surgery.  If it is not removed, there is a chance that hospital personnel will need to cut it off on the day of surgery.  Do not wear lotions, powders, or perfumes.   Contact lenses, hearing aids and dentures may not be worn into surgery.  Do not bring valuables to the hospital. [facility] is not responsible for any missing/lost belongings or valuables.   Notify your doctor if there is any change in your medical condition (cold, fever, infection).  Wear comfortable clothing (specific to your surgery type) to the hospital.  After surgery, you can help prevent lung complications by doing breathing exercises.  Take deep breaths and cough every 1-2 hours. Your doctor may order  a device called an Incentive Spirometer to help you take deep breaths.  When coughing or sneezing, hold a pillow firmly against your incision with both hands. This is called "splinting." Doing this helps protect your incision. It also decreases belly discomfort.  If you are being admitted to the hospital overnight, leave your suitcase in the car. After surgery it may be brought to your room.  In case of  increased patient census, it may be necessary for you, the patient, to continue your postoperative care in the Same Day Surgery department.  If you are being discharged the day of surgery, you will not be allowed to drive home. You will need a responsible individual to drive you home and stay with you for 24 hours after surgery.   If you are taking public transportation, you will need to have a responsible individual with you.  Please call the Pre-admissions Testing Dept. at [phone] if you have any questions about these instructions.  Surgery Visitation Policy:  Patients having surgery or a procedure may have two visitors.  Children under the age of 71 must have an adult with them who is not the patient.  Inpatient Visitation:    Visiting hours are 7 a.m. to 8 p.m. Up to four visitors are allowed at one time in a patient room. The visitors may rotate out with other people during the day.  One visitor age 1 or older may stay with the patient overnight and must be in the room by 8 p.m.   Merchandiser, retail to address health-related social needs:  [email or web address]    Pre-operative 5 CHG Bath Instructions   You can play a key role in reducing the risk of infection after surgery. Your skin needs to be as free of germs as possible. You can reduce the number of germs on your skin by washing with CHG (chlorhexidine gluconate) soap before surgery. CHG is an antiseptic soap that kills germs and continues to kill germs even after washing.   DO NOT use if you have an allergy to chlorhexidine/CHG or antibacterial soaps. If your skin becomes reddened or irritated, stop using the CHG and notify one of our RNs at [phone].   Please shower with the CHG soap starting 4 days before surgery using the following schedule: 09/13 - 09/17.    Please keep in mind the following:  DO NOT shave, including legs and underarms, starting the day of your first shower.   You  may shave your face at any point before/day of surgery.  Place clean sheets on your bed the day you start using CHG soap. Use a clean washcloth (not used since being washed) for each shower. DO NOT sleep with pets once you start using the CHG.   CHG Shower Instructions:  If you choose to wash your hair and private area, wash first with your normal shampoo/soap.  After you use shampoo/soap, rinse your hair and body thoroughly to remove shampoo/soap residue.  Turn the water OFF and apply about 3 tablespoons (45 ml) of CHG soap to a CLEAN washcloth.  Apply CHG soap ONLY FROM YOUR NECK DOWN TO YOUR TOES (washing for 3-5 minutes)  DO NOT use CHG soap on face, private areas, open wounds, or sores.  Pay special attention to the area where your surgery is being performed.  If you are having back surgery, having someone wash your back for you may be helpful. Wait 2 minutes after CHG soap is applied, then you may rinse off  the CHG soap.  Pat dry with a clean towel  Put on clean clothes/pajamas   If you choose to wear lotion, please use ONLY the CHG-compatible lotions on the back of this paper.     Additional instructions for the day of surgery: DO NOT APPLY any lotions, deodorants, cologne, or perfumes.   Put on clean/comfortable clothes.  Brush your teeth.  Ask your nurse before applying any prescription medications to the skin.      CHG Compatible Lotions   Aveeno Moisturizing lotion  Cetaphil Moisturizing Cream  Cetaphil Moisturizing Lotion  Clairol Herbal Essence Moisturizing Lotion, Dry Skin  Clairol Herbal Essence Moisturizing Lotion, Extra Dry Skin  Clairol Herbal Essence Moisturizing Lotion, Normal Skin  Curel Age Defying Therapeutic Moisturizing Lotion with Alpha Hydroxy  Curel Extreme Care Body Lotion  Curel Soothing Hands Moisturizing Hand Lotion  Curel Therapeutic Moisturizing Cream, Fragrance-Free  Curel Therapeutic Moisturizing Lotion, Fragrance-Free  Curel Therapeutic  Moisturizing Lotion, Original Formula  Eucerin Daily Replenishing Lotion  Eucerin Dry Skin Therapy Plus Alpha Hydroxy Crme  Eucerin Dry Skin Therapy Plus Alpha Hydroxy Lotion  Eucerin Original Crme  Eucerin Original Lotion  Eucerin Plus Crme Eucerin Plus Lotion  Eucerin TriLipid Replenishing Lotion  Keri Anti-Bacterial Hand Lotion  Keri Deep Conditioning Original Lotion Dry Skin Formula Softly Scented  Keri Deep Conditioning Original Lotion, Fragrance Free Sensitive Skin Formula  Keri Lotion Fast Absorbing Fragrance Free Sensitive Skin Formula  Keri Lotion Fast Absorbing Softly Scented Dry Skin Formula  Keri Original Lotion  Keri Skin Renewal Lotion Keri Silky Smooth Lotion  Keri Silky Smooth Sensitive Skin Lotion  Nivea Body Creamy Conditioning Oil  Nivea Body Extra Enriched Teacher, adult education Moisturizing Lotion Nivea Crme  Nivea Skin Firming Lotion  NutraDerm 30 Skin Lotion  NutraDerm Skin Lotion  NutraDerm Therapeutic Skin Cream  NutraDerm Therapeutic Skin Lotion  ProShield Protective Hand Cream  Provon moisturizing lotion  POLAR CARE INFORMATION  MassAdvertisement.it  How to use Breg Polar Care Glacier Cold Therapy System?  YouTube   ShippingScam.co.uk  OPERATING INSTRUCTIONS  Start the product With dry hands, connect the transformer to the electrical connection located on the top of the cooler. Next, plug the transformer into an appropriate electrical outlet. The unit will automatically start running at this point.  To stop the pump, disconnect electrical power.  Unplug to stop the product when not in use. Unplugging the Polar Care unit turns it off. Always unplug immediately after use. Never leave it plugged in while unattended. Remove pad.    FIRST ADD WATER TO FILL LINE, THEN ICE---Replace ice when existing ice is almost melted  1 Discuss Treatment with your Licensed Health Care Practitioner and Use Only as  Prescribed 2 Apply Insulation Barrier & Cold Therapy Pad 3 Check for Moisture 4 Inspect Skin Regularly  Tips and Trouble Shooting Usage Tips 1. Use cubed or chunked ice for optimal performance. 2. It is recommended to drain the Pad between uses. To drain the pad, hold the Pad upright with the hose pointed toward the ground. Depress the black plunger and allow water to drain out. 3. You may disconnect the Pad from the unit without removing the pad from the affected area by depressing the silver tabs on the hose coupling and gently pulling the hoses apart. The Pad and unit will seal itself and will not leak. Note: Some dripping during release is normal. 4. DO NOT RUN PUMP WITHOUT WATER! The pump in this  unit is designed to run with water. Running the unit without water will cause permanent damage to the pump. 5. Unplug unit before removing lid.  TROUBLESHOOTING GUIDE Pump not running, Water not flowing to the pad, Pad is not getting cold 1. Make sure the transformer is plugged into the wall outlet. 2. Confirm that the ice and water are filled to the indicated levels. 3. Make sure there are no kinks in the pad. 4. Gently pull on the blue tube to make sure the tube/pad junction is straight. 5. Remove the pad from the treatment site and ll it while the pad is lying at; then reapply. 6. Confirm that the pad couplings are securely attached to the unit. Listen for the double clicks (Figure 1) to confirm the pad couplings are securely attached.  Leaks    Note: Some condensation on the lines, controller, and pads is unavoidable, especially in warmer climates. 1. If using a Breg Polar Care Cold Therapy unit with a detachable Cold Therapy Pad, and a leak exists (other than condensation on the lines) disconnect the pad couplings. Make sure the silver tabs on the couplings are depressed before reconnecting the pad to the pump hose; then confirm both sides of the coupling are properly clicked in. 2. If the  coupling continues to leak or a leak is detected in the pad itself, stop using it and call B[employer] Customer Care at [phone].  Cleaning After use, empty and dry the unit with a soft cloth. Warm water and mild detergent may be used occasionally to clean the pump and tubes.  WARNING: The Polar Care Cube can be cold enough to cause serious injury, including full skin necrosis. Follow these Operating Instructions, and carefully read the Product Insert (see pouch on side of unit) and the Cold Therapy Pad Fitting Instructions (provided with each Cold Therapy Pad) prior to use.  How to Use an Incentive Spirometer  An incentive spirometer is a tool that measures how well you are filling your lungs with each breath. Learning to take long, deep breaths using this tool can help you keep your lungs clear and active. This may help to reverse or lessen your chance of developing breathing (pulmonary) problems, especially infection. You may be asked to use a spirometer: After a surgery. If you have a lung problem or a history of smoking. After a long period of time when you have been unable to move or be active. If the spirometer includes an indicator to show the highest number that you have reached, your health care provider or respiratory therapist will help you set a goal. Keep a log of your progress as told by your health care provider. What are the risks? Breathing too quickly may cause dizziness or cause you to pass out. Take your time so you do not get dizzy or light-headed. If you are in pain, you may need to take pain medicine before doing incentive spirometry. It is harder to take a deep breath if you are having pain. How to use your incentive spirometer  Sit up on the edge of your bed or on a chair. Hold the incentive spirometer so that it is in an upright position. Before you use the spirometer, breathe out normally. Place the mouthpiece in your mouth. Make sure your lips are closed tightly  around it. Breathe in slowly and as deeply as you can through your mouth, causing the piston or the ball to rise toward the top of the chamber. Hold your  breath for 3-5 seconds, or for as long as possible. If the spirometer includes a coach indicator, use this to guide you in breathing. Slow down your breathing if the indicator goes above the marked areas. Remove the mouthpiece from your mouth and breathe out normally. The piston or ball will return to the bottom of the chamber. Rest for a few seconds, then repeat the steps 10 or more times. Take your time and take a few normal breaths between deep breaths so that you do not get dizzy or light-headed. Do this every 1-2 hours when you are awake. If the spirometer includes a goal marker to show the highest number you have reached (best effort), use this as a goal to work toward during each repetition. After each set of 10 deep breaths, cough a few times. This will help to make sure that your lungs are clear. If you have an incision on your chest or abdomen from surgery, place a pillow or a rolled-up towel firmly against the incision when you cough. This can help to reduce pain while taking deep breaths and coughing. General tips When you are able to get out of bed: Walk around often. Continue to take deep breaths and cough in order to clear your lungs. Keep using the incentive spirometer until your health care provider says it is okay to stop using it. If you have been in the hospital, you may be told to keep using the spirometer at home. Contact a health care provider if: You are having difficulty using the spirometer. You have trouble using the spirometer as often as instructed. Your pain medicine is not giving enough relief for you to use the spirometer as told. You have a fever. Get help right away if: You develop shortness of breath. You develop a cough with bloody mucus from the lungs. You have fluid or blood coming from an incision site  after you cough. Summary An incentive spirometer is a tool that can help you learn to take long, deep breaths to keep your lungs clear and active. You may be asked to use a spirometer after a surgery, if you have a lung problem or a history of smoking, or if you have been inactive for a long period of time. Use your incentive spirometer as instructed every 1-2 hours while you are awake. If you have an incision on your chest or abdomen, place a pillow or a rolled-up towel firmly against your incision when you cough. This will help to reduce pain. Get help right away if you have shortness of breath, you cough up bloody mucus, or blood comes from your incision when you cough. This information is not intended to replace advice given to you by your health care provider. Make sure you discuss any questions you have with your health care provider. Document Revised: 04/01/2019 Document Reviewed: 04/01/2019 Elsevier Patient Education  2023 Elsevier Inc.  Preoperative Educational Videos for Total Hip, Knee and Shoulder Replacements  To better prepare for surgery, please view our videos that explain the physical activity and discharge planning required to have the best surgical recovery at [facility].  [email or web address]  Questions? Call [phone] or email [email or web address]

## 2023-10-06 LAB — URINE CULTURE

## 2023-10-10 ENCOUNTER — Encounter: Payer: Self-pay | Admitting: Orthopedic Surgery

## 2023-10-10 NOTE — H&P (Signed)
 ORTHOPAEDIC HISTORY & PHYSICAL Drake Fonda [place], [place] - 09/28/2023 3:15 PM EDT Formatting of this note is different from the original. NAME: Marissa Roman H&P Date: 09/29/2023 Procedure Date: 10/11/2023  Chief Complaint: right knee pain, swelling, and giving way  HPI Marissa Roman is a 63 y.o. female who has severe Right knee pain. Patient reports an approximate 5-year history of right knee pain that has progressively gotten worse over the last year. She states that most of the pain localizes along the anterior and medial aspect of her right knee. She does report intermittent swelling and feelings of giving way of the knee. She denies any locking or maltracking symptoms. She states that the pain is aggravated with any prolonged weightbearing or ambulation. It greatly affects her ability to ambulate long distances and perform her ADLs as she would like. She has failed conservative treatment including NSAIDs, knee bracing, activity modification and intra-articular corticosteroid injections. She does not currently utilize any ambulatory aids. She has requested operative intervention for relief of her DJD symptoms. Patient denies any previous cardiac or pulmonary history. No previous DVTs or clots. She is not a diabetic. She denies having any previous surgeries on this knee.  Social Hx: Patient lives at home by herself, but states that she will be staying with her mother and sister who live right down the street after surgery to have them help look after her. She works as a Optician, dispensing. She denies any smoking or nicotine use. No alcohol or illicit drug use.  Medications & Allergies Allergies: No Known Allergies  Home Medicines: Current Outpatient Medications on File Prior to Visit Medication Sig Dispense Refill amLODIPine  (NORVASC ) 10 MG tablet Take 1 tablet by mouth once daily aspirin  81 mg Cap Take 1 tablet by mouth once daily atorvastatin  (LIPITOR) 10 MG tablet Take 1 tablet by  mouth once daily  No current facility-administered medications on file prior to visit.  Medical / Surgical History  Past Medical History: Diagnosis Date Chicken pox Hypertension Obesity   Past Surgical History: Procedure Laterality Date KNEE ARTHROSCOPY Left 2000 LEFT ACL/MCL REPAIR TAH FOR FIBROIDS SUPERCERVICAL 2005   Physical Exam  Ht:177.8 cm (5' 10) Wt:(!) 133.5 kg (294 lb 4.8 oz) BMI: Body mass index is 42.23 kg/m.  General/Constitutional: No apparent distress: well-nourished and well developed. Eyes: Pupils equal, round with synchronous movement. Lymphatic: No palpable adenopathy. Respiratory: Patient has good chest rise and fall with inspiration and expiration. All lung fields are clear to auscultation bilaterally. There is no Rales, rhonchi or wheezes appreciated. Cardiovascular: Upon auscultation there is a regular rate and rhythm without any murmurs, rubs, gallops or heaves appreciated. There does not appear to be any swelling down the lower extremities. Posterior tibial pulses appreciated bilaterally, 2+. Integumentary: No impressive skin lesions present, except as noted in detailed exam. Neuro/Psych: Normal mood and affect, oriented to person, place and time. Musculoskeletal: see exam below  Right knee exam Upon inspection of the patient's right knee there does not appear to be any skin changes, open abrasions, or redness. Mild swelling appreciated there is a varus alignment. Upon palpation, the patient reports having pain along the medial aspect of their knee. Patient has 3 degrees off of full extension actively with ROM, and able to flex back to 122 degrees with mild pain. Varus and valgus stress testing shows positive pseudolaxity to valgus stressing. The patella tracks well within the femoral groove from flexion into extension with mild crepitus appreciated. Anterior and posterior drawer testing  negative. Patient is neurovascularly intact down their lower  extremity to all dermatomes. Posterior tibial pulses appreciated 2+.  Imaging right Knee Imaging: None ordered today. Previous images were reviewed from 07/14/2023. These images show noticeable loss of cartilage space along the medial aspect of the patient's right knee. There is near bone-on-bone articulation noted. Subchondral sclerosis is appreciated. Osteophyte formation is present. Overall alignment is relative varus. No fractures, lytic lesions or gross fomites appreciated on films.  Assesment and Plan Knee DJD  I have recommended that Marissa Roman undergo right total knee replacement. Consents has been signed. The risks, benefits, prognosis and alternatives including but not limited to DVT, PE, infection, neurovascular injury, failure of the procedure and death were explained to the patient and she is willing to proceed with surgery as described to her by myself. Plan will be for post operative admission of at least 1 midnight for pain control and PT. She will be managed with DVT prophylaxis, antibiotics preoperatively for 24 hours and aggressive in patient rehab.  Pre, intra and post op interventions were discussed. Patient has good understanding  Medication Reconciliation was performed. Discussed cessation of vitamins and supplements.  A total of 45 minutes was spent reviewing patient's charts, medical reconciliation, discussing/educating the patient about surgical interventions, and answering any questions provided by the patient.  JOSHUA DALLAS KOYANAGI, PA [facility] 09/28/2023  Electronically signed by KOYANAGI Fonda DALLAS, PA at 09/29/2023 3:30 PM EDT

## 2023-10-11 ENCOUNTER — Observation Stay

## 2023-10-11 ENCOUNTER — Observation Stay
Admission: RE | Admit: 2023-10-11 | Discharge: 2023-10-12 | Disposition: A | Discharge status: Home / Self Care | Attending: Orthopedic Surgery | Admitting: Orthopedic Surgery

## 2023-10-11 ENCOUNTER — Encounter: Payer: Self-pay | Admitting: Orthopedic Surgery

## 2023-10-11 ENCOUNTER — Ambulatory Visit: Payer: Self-pay | Admitting: Urgent Care

## 2023-10-11 ENCOUNTER — Other Ambulatory Visit: Payer: Self-pay

## 2023-10-11 ENCOUNTER — Ambulatory Visit

## 2023-10-11 ENCOUNTER — Encounter
Admission: RE | Disposition: A | Discharge status: Home / Self Care | Payer: Self-pay | Source: Home / Self Care | Attending: Orthopedic Surgery

## 2023-10-11 DIAGNOSIS — Z96651 Presence of right artificial knee joint: Secondary | ICD-10-CM

## 2023-10-11 DIAGNOSIS — Z79899 Other long term (current) drug therapy: Secondary | ICD-10-CM | POA: Diagnosis not present

## 2023-10-11 DIAGNOSIS — M1711 Unilateral primary osteoarthritis, right knee: Secondary | ICD-10-CM | POA: Diagnosis not present

## 2023-10-11 DIAGNOSIS — I10 Essential (primary) hypertension: Secondary | ICD-10-CM | POA: Insufficient documentation

## 2023-10-11 DIAGNOSIS — Z7982 Long term (current) use of aspirin: Secondary | ICD-10-CM | POA: Insufficient documentation

## 2023-10-11 DIAGNOSIS — M25561 Pain in right knee: Secondary | ICD-10-CM | POA: Diagnosis present

## 2023-10-11 HISTORY — PX: KNEE ARTHROPLASTY: SHX992

## 2023-10-11 SURGERY — ARTHROPLASTY, KNEE, TOTAL, USING IMAGELESS COMPUTER-ASSISTED NAVIGATION
Anesthesia: General | Site: Knee | Laterality: Right

## 2023-10-11 MED ORDER — ATORVASTATIN CALCIUM 10 MG PO TABS
10.0000 mg | ORAL_TABLET | Freq: Every day | ORAL | Status: DC
  Administered 2023-10-11 – 2023-10-12 (×2): 10 mg via ORAL
  Filled 2023-10-11 (×2): qty 1

## 2023-10-11 MED ORDER — SUCCINYLCHOLINE CHLORIDE 200 MG/10ML IV SOSY
PREFILLED_SYRINGE | INTRAVENOUS | Status: DC | PRN
  Administered 2023-10-11: 100 mg via INTRAVENOUS

## 2023-10-11 MED ORDER — FERROUS SULFATE 325 (65 FE) MG PO TABS
325.0000 mg | ORAL_TABLET | Freq: Two times a day (BID) | ORAL | Status: DC
  Administered 2023-10-11 – 2023-10-12 (×2): 325 mg via ORAL
  Filled 2023-10-11 (×2): qty 1

## 2023-10-11 MED ORDER — CHLORHEXIDINE GLUCONATE 0.12 % MT SOLN
15.0000 mL | Freq: Once | OROMUCOSAL | Status: AC
  Administered 2023-10-11: 15 mL via OROMUCOSAL

## 2023-10-11 MED ORDER — TRAMADOL HCL 50 MG PO TABS: 50.0000 mg | ORAL_TABLET | ORAL | Status: DC | PRN

## 2023-10-11 MED ORDER — CEFAZOLIN SODIUM 1 G IJ SOLR
INTRAMUSCULAR | Status: AC
  Filled 2023-10-11: qty 10

## 2023-10-11 MED ORDER — TRANEXAMIC ACID-NACL 1000-0.7 MG/100ML-% IV SOLN
1000.0000 mg | Freq: Once | INTRAVENOUS | Status: AC
  Administered 2023-10-11: 1000 mg via INTRAVENOUS

## 2023-10-11 MED ORDER — ENSURE PRE-SURGERY PO LIQD
296.0000 mL | Freq: Once | ORAL | Status: AC
  Administered 2023-10-11: 296 mL via ORAL
  Filled 2023-10-11: qty 296

## 2023-10-11 MED ORDER — FENTANYL CITRATE (PF) 100 MCG/2ML IJ SOLN
INTRAMUSCULAR | Status: AC
  Filled 2023-10-11: qty 2

## 2023-10-11 MED ORDER — ONDANSETRON HCL 4 MG/2ML IJ SOLN: 4.0000 mg | Freq: Four times a day (QID) | INTRAMUSCULAR | Status: DC | PRN

## 2023-10-11 MED ORDER — PROPOFOL 10 MG/ML IV BOLUS
INTRAVENOUS | Status: DC | PRN
  Administered 2023-10-11: 60 ug/kg/min via INTRAVENOUS
  Administered 2023-10-11: 250 mg via INTRAVENOUS

## 2023-10-11 MED ORDER — SODIUM CHLORIDE (PF) 0.9 % IJ SOLN
INTRAMUSCULAR | Status: AC
  Filled 2023-10-11: qty 40

## 2023-10-11 MED ORDER — PROPOFOL 10 MG/ML IV BOLUS
INTRAVENOUS | Status: AC
  Filled 2023-10-11: qty 40

## 2023-10-11 MED ORDER — TRANEXAMIC ACID-NACL 1000-0.7 MG/100ML-% IV SOLN
INTRAVENOUS | Status: AC
  Filled 2023-10-11: qty 100

## 2023-10-11 MED ORDER — FENTANYL CITRATE (PF) 100 MCG/2ML IJ SOLN: 25.0000 ug | INTRAMUSCULAR | Status: DC | PRN

## 2023-10-11 MED ORDER — ASPIRIN 81 MG PO CHEW
81.0000 mg | CHEWABLE_TABLET | Freq: Two times a day (BID) | ORAL | Status: DC
  Administered 2023-10-12: 81 mg via ORAL
  Filled 2023-10-11: qty 1

## 2023-10-11 MED ORDER — MIDAZOLAM HCL 2 MG/2ML IJ SOLN
INTRAMUSCULAR | Status: AC
  Filled 2023-10-11: qty 2

## 2023-10-11 MED ORDER — AMLODIPINE BESYLATE 10 MG PO TABS
10.0000 mg | ORAL_TABLET | Freq: Every day | ORAL | Status: DC
  Administered 2023-10-11 – 2023-10-12 (×2): 10 mg via ORAL
  Filled 2023-10-11 (×2): qty 1

## 2023-10-11 MED ORDER — CEFAZOLIN SODIUM-DEXTROSE 2-4 GM/100ML-% IV SOLN
2.0000 g | Freq: Four times a day (QID) | INTRAVENOUS | Status: AC
  Administered 2023-10-11 – 2023-10-12 (×2): 2 g via INTRAVENOUS
  Filled 2023-10-11: qty 100

## 2023-10-11 MED ORDER — CELECOXIB 200 MG PO CAPS
200.0000 mg | ORAL_CAPSULE | Freq: Two times a day (BID) | ORAL | Status: DC
  Administered 2023-10-11 – 2023-10-12 (×2): 200 mg via ORAL
  Filled 2023-10-11 (×2): qty 1

## 2023-10-11 MED ORDER — SODIUM CHLORIDE 0.9 % IV SOLN: INTRAVENOUS | Status: DC

## 2023-10-11 MED ORDER — PROPOFOL 1000 MG/100ML IV EMUL
INTRAVENOUS | Status: AC
  Filled 2023-10-11: qty 100

## 2023-10-11 MED ORDER — ONDANSETRON HCL 4 MG/2ML IJ SOLN
INTRAMUSCULAR | Status: DC | PRN
  Administered 2023-10-11: 4 mg via INTRAVENOUS

## 2023-10-11 MED ORDER — BUPIVACAINE HCL (PF) 0.5 % IJ SOLN
INTRAMUSCULAR | Status: AC
  Filled 2023-10-11: qty 10

## 2023-10-11 MED ORDER — HYDROMORPHONE HCL 1 MG/ML IJ SOLN
INTRAMUSCULAR | Status: AC
  Filled 2023-10-11: qty 1

## 2023-10-11 MED ORDER — TRANEXAMIC ACID-NACL 1000-0.7 MG/100ML-% IV SOLN
1000.0000 mg | INTRAVENOUS | Status: AC
  Administered 2023-10-11: 1000 mg via INTRAVENOUS

## 2023-10-11 MED ORDER — LIDOCAINE HCL (PF) 2 % IJ SOLN
INTRAMUSCULAR | Status: DC | PRN
Start: 2023-10-11 — End: 2023-10-11
  Administered 2023-10-11: 100 mg via INTRADERMAL

## 2023-10-11 MED ORDER — DIPHENHYDRAMINE HCL 12.5 MG/5ML PO ELIX: 12.5000 mg | ORAL_SOLUTION | ORAL | Status: DC | PRN

## 2023-10-11 MED ORDER — ACETAMINOPHEN 10 MG/ML IV SOLN
INTRAVENOUS | Status: AC
  Filled 2023-10-11: qty 100

## 2023-10-11 MED ORDER — TRANEXAMIC ACID 1000 MG/10ML IV SOLN: INTRAVENOUS | Status: DC | PRN

## 2023-10-11 MED ORDER — PHENYLEPHRINE 80 MCG/ML (10ML) SYRINGE FOR IV PUSH (FOR BLOOD PRESSURE SUPPORT)
PREFILLED_SYRINGE | INTRAVENOUS | Status: AC
  Filled 2023-10-11: qty 10

## 2023-10-11 MED ORDER — ROCURONIUM BROMIDE 100 MG/10ML IV SOLN
INTRAVENOUS | Status: DC | PRN
  Administered 2023-10-11: 40 mg via INTRAVENOUS
  Administered 2023-10-11: 10 mg via INTRAVENOUS
  Administered 2023-10-11: 20 mg via INTRAVENOUS
  Administered 2023-10-11 (×3): 10 mg via INTRAVENOUS

## 2023-10-11 MED ORDER — CELECOXIB 200 MG PO CAPS
400.0000 mg | ORAL_CAPSULE | Freq: Once | ORAL | Status: AC
  Administered 2023-10-11: 400 mg via ORAL

## 2023-10-11 MED ORDER — ONDANSETRON HCL 4 MG/2ML IJ SOLN
INTRAMUSCULAR | Status: AC
  Filled 2023-10-11: qty 2

## 2023-10-11 MED ORDER — ACETAMINOPHEN 325 MG PO TABS: 325.0000 mg | ORAL_TABLET | Freq: Four times a day (QID) | ORAL | Status: DC | PRN

## 2023-10-11 MED ORDER — CEFAZOLIN SODIUM-DEXTROSE 3-4 GM/150ML-% IV SOLN
3.0000 g | INTRAVENOUS | Status: AC
  Administered 2023-10-11 (×2): 3 g via INTRAVENOUS
  Filled 2023-10-11: qty 150

## 2023-10-11 MED ORDER — KETAMINE HCL 10 MG/ML IJ SOLN: INTRAMUSCULAR | Status: DC | PRN

## 2023-10-11 MED ORDER — GABAPENTIN 300 MG PO CAPS
300.0000 mg | ORAL_CAPSULE | Freq: Once | ORAL | Status: AC
  Administered 2023-10-11: 300 mg via ORAL

## 2023-10-11 MED ORDER — MIDAZOLAM HCL 2 MG/2ML IJ SOLN
INTRAMUSCULAR | Status: DC | PRN
  Administered 2023-10-11: 2 mg via INTRAVENOUS

## 2023-10-11 MED ORDER — SODIUM CHLORIDE (PF) 0.9 % IJ SOLN
INTRAMUSCULAR | Status: DC | PRN
  Administered 2023-10-11: 120 mL via INTRAMUSCULAR

## 2023-10-11 MED ORDER — BUPIVACAINE LIPOSOME 1.3 % IJ SUSP
INTRAMUSCULAR | Status: AC
  Filled 2023-10-11: qty 20

## 2023-10-11 MED ORDER — DEXAMETHASONE SODIUM PHOSPHATE 10 MG/ML IJ SOLN
INTRAMUSCULAR | Status: AC
  Filled 2023-10-11: qty 1

## 2023-10-11 MED ORDER — DEXAMETHASONE SODIUM PHOSPHATE 10 MG/ML IJ SOLN
INTRAMUSCULAR | Status: DC | PRN
  Administered 2023-10-11: 5 mg via INTRAVENOUS

## 2023-10-11 MED ORDER — SURGIPHOR WOUND IRRIGATION SYSTEM - OPTIME
TOPICAL | Status: DC | PRN
  Administered 2023-10-11: 450 mL

## 2023-10-11 MED ORDER — ONDANSETRON HCL 4 MG PO TABS
4.0000 mg | ORAL_TABLET | Freq: Four times a day (QID) | ORAL | Status: DC | PRN
  Administered 2023-10-11: 4 mg via ORAL
  Filled 2023-10-11: qty 1

## 2023-10-11 MED ORDER — OXYCODONE HCL 5 MG/5ML PO SOLN: 5.0000 mg | Freq: Once | ORAL | Status: DC | PRN

## 2023-10-11 MED ORDER — ORAL CARE MOUTH RINSE: 15.0000 mL | Freq: Once | OROMUCOSAL | Status: AC

## 2023-10-11 MED ORDER — PHENOL 1.4 % MT LIQD: 1.0000 | OROMUCOSAL | Status: DC | PRN

## 2023-10-11 MED ORDER — MENTHOL 3 MG MT LOZG: 1.0000 | LOZENGE | OROMUCOSAL | Status: DC | PRN

## 2023-10-11 MED ORDER — BISACODYL 10 MG RE SUPP: 10.0000 mg | Freq: Every day | RECTAL | Status: DC | PRN

## 2023-10-11 MED ORDER — PHENYLEPHRINE HCL-NACL 20-0.9 MG/250ML-% IV SOLN
INTRAVENOUS | Status: AC
  Filled 2023-10-11: qty 250

## 2023-10-11 MED ORDER — ONDANSETRON HCL 4 MG/2ML IJ SOLN
INTRAMUSCULAR | Status: AC
Start: 2023-10-11 — End: 2023-10-11
  Filled 2023-10-11: qty 2

## 2023-10-11 MED ORDER — HYDROMORPHONE HCL 1 MG/ML IJ SOLN: 0.5000 mg | INTRAMUSCULAR | Status: DC | PRN

## 2023-10-11 MED ORDER — CEFAZOLIN SODIUM 1 G IJ SOLR
INTRAMUSCULAR | Status: AC
  Filled 2023-10-11: qty 20

## 2023-10-11 MED ORDER — ACETAMINOPHEN 10 MG/ML IV SOLN
1000.0000 mg | Freq: Four times a day (QID) | INTRAVENOUS | Status: DC
  Administered 2023-10-11 – 2023-10-12 (×2): 1000 mg via INTRAVENOUS
  Filled 2023-10-11 (×2): qty 100

## 2023-10-11 MED ORDER — GABAPENTIN 300 MG PO CAPS
ORAL_CAPSULE | ORAL | Status: AC
  Filled 2023-10-11: qty 1

## 2023-10-11 MED ORDER — ACETAMINOPHEN 10 MG/ML IV SOLN
INTRAVENOUS | Status: DC | PRN
  Administered 2023-10-11: 1000 mg via INTRAVENOUS

## 2023-10-11 MED ORDER — PANTOPRAZOLE SODIUM 40 MG PO TBEC
40.0000 mg | DELAYED_RELEASE_TABLET | Freq: Two times a day (BID) | ORAL | Status: DC
  Administered 2023-10-11 – 2023-10-12 (×2): 40 mg via ORAL
  Filled 2023-10-11 (×2): qty 1

## 2023-10-11 MED ORDER — CHLORHEXIDINE GLUCONATE 4 % EX SOLN
60.0000 mL | Freq: Once | CUTANEOUS | Status: AC
  Administered 2023-10-11: 4 via TOPICAL

## 2023-10-11 MED ORDER — HYDROMORPHONE HCL 1 MG/ML IJ SOLN
INTRAMUSCULAR | Status: DC | PRN
  Administered 2023-10-11 (×2): 1 mg via INTRAVENOUS

## 2023-10-11 MED ORDER — BUPIVACAINE HCL (PF) 0.25 % IJ SOLN
INTRAMUSCULAR | Status: AC
  Filled 2023-10-11: qty 60

## 2023-10-11 MED ORDER — METOCLOPRAMIDE HCL 10 MG PO TABS
10.0000 mg | ORAL_TABLET | Freq: Three times a day (TID) | ORAL | Status: DC
  Administered 2023-10-11 – 2023-10-12 (×3): 10 mg via ORAL
  Filled 2023-10-11 (×3): qty 1

## 2023-10-11 MED ORDER — KETAMINE HCL 50 MG/5ML IJ SOSY
PREFILLED_SYRINGE | INTRAMUSCULAR | Status: AC
  Filled 2023-10-11: qty 5

## 2023-10-11 MED ORDER — DEXAMETHASONE SODIUM PHOSPHATE 10 MG/ML IJ SOLN
8.0000 mg | Freq: Once | INTRAMUSCULAR | Status: AC
  Administered 2023-10-11: 8 mg via INTRAVENOUS

## 2023-10-11 MED ORDER — ALUM & MAG HYDROXIDE-SIMETH 200-200-20 MG/5ML PO SUSP: 30.0000 mL | ORAL | Status: DC | PRN

## 2023-10-11 MED ORDER — CELECOXIB 200 MG PO CAPS
ORAL_CAPSULE | ORAL | Status: AC
  Filled 2023-10-11: qty 2

## 2023-10-11 MED ORDER — CHLORHEXIDINE GLUCONATE 0.12 % MT SOLN
OROMUCOSAL | Status: AC
Start: 2023-10-11 — End: 2023-10-11
  Filled 2023-10-11: qty 15

## 2023-10-11 MED ORDER — PHENYLEPHRINE HCL-NACL 20-0.9 MG/250ML-% IV SOLN
INTRAVENOUS | Status: DC | PRN
  Administered 2023-10-11: 40 ug/min via INTRAVENOUS

## 2023-10-11 MED ORDER — FLEET ENEMA RE ENEM: 1.0000 | ENEMA | Freq: Once | RECTAL | Status: DC | PRN

## 2023-10-11 MED ORDER — MAGNESIUM HYDROXIDE 400 MG/5ML PO SUSP
30.0000 mL | Freq: Every day | ORAL | Status: DC
  Filled 2023-10-11: qty 30

## 2023-10-11 MED ORDER — LIDOCAINE HCL (PF) 2 % IJ SOLN
INTRAMUSCULAR | Status: AC
  Filled 2023-10-11: qty 5

## 2023-10-11 MED ORDER — OXYCODONE HCL 5 MG PO TABS
10.0000 mg | ORAL_TABLET | ORAL | Status: DC | PRN
  Administered 2023-10-11: 10 mg via ORAL
  Filled 2023-10-11: qty 2

## 2023-10-11 MED ORDER — OXYCODONE HCL 5 MG PO TABS: 5.0000 mg | ORAL_TABLET | Freq: Once | ORAL | Status: DC | PRN

## 2023-10-11 MED ORDER — PHENYLEPHRINE 80 MCG/ML (10ML) SYRINGE FOR IV PUSH (FOR BLOOD PRESSURE SUPPORT)
PREFILLED_SYRINGE | INTRAVENOUS | Status: DC | PRN
  Administered 2023-10-11: 160 ug via INTRAVENOUS

## 2023-10-11 MED ORDER — OXYCODONE HCL 5 MG PO TABS
5.0000 mg | ORAL_TABLET | ORAL | Status: DC | PRN
  Administered 2023-10-12: 5 mg via ORAL
  Filled 2023-10-11: qty 1

## 2023-10-11 MED ORDER — SODIUM CHLORIDE 0.9 % IR SOLN
Status: DC | PRN
  Administered 2023-10-11: 3000 mL

## 2023-10-11 MED ORDER — FENTANYL CITRATE (PF) 100 MCG/2ML IJ SOLN
INTRAMUSCULAR | Status: DC | PRN
  Administered 2023-10-11 (×2): 100 ug via INTRAVENOUS

## 2023-10-11 MED ORDER — KETAMINE HCL 50 MG/5ML IJ SOSY
PREFILLED_SYRINGE | INTRAMUSCULAR | Status: DC | PRN
  Administered 2023-10-11 (×2): 20 mg via INTRAVENOUS
  Administered 2023-10-11: 10 mg via INTRAVENOUS

## 2023-10-11 MED ORDER — SENNOSIDES-DOCUSATE SODIUM 8.6-50 MG PO TABS
1.0000 | ORAL_TABLET | Freq: Two times a day (BID) | ORAL | Status: DC
  Administered 2023-10-11 – 2023-10-12 (×2): 1 via ORAL
  Filled 2023-10-11 (×2): qty 1

## 2023-10-11 MED ORDER — LACTATED RINGERS IV SOLN: INTRAVENOUS | Status: DC

## 2023-10-11 MED ORDER — SUGAMMADEX SODIUM 200 MG/2ML IV SOLN
INTRAVENOUS | Status: DC | PRN
  Administered 2023-10-11: 300 mg via INTRAVENOUS

## 2023-10-11 SURGICAL SUPPLY — 64 items
ATTUNE MED DOME PAT 41 KNEE (Knees) IMPLANT
ATTUNE PS FEM RT SZ 7 CEM KNEE (Femur) IMPLANT
ATTUNE PSRP INSR SZ7 5 KNEE (Insert) IMPLANT
BASE TIBIAL ROT PLAT SZ 5 KNEE (Knees) IMPLANT
BATTERY INSTRU NAVIGATION (MISCELLANEOUS) ×4 IMPLANT
BIT DRILL QUICK REL 1/8 2PK SL (BIT) ×1 IMPLANT
BLADE CLIPPER SURG (BLADE) IMPLANT
BLADE SAGITTAL 25.0X1.19X90 (BLADE) IMPLANT
BLADE SAW 70X12.5 (BLADE) ×1 IMPLANT
BLADE SAW 90X13X1.19 OSCILLAT (BLADE) ×1 IMPLANT
BLADE SAW 90X25X1.19 OSCILLAT (BLADE) ×1 IMPLANT
BRUSH SCRUB EZ PLAIN DRY (MISCELLANEOUS) ×1 IMPLANT
CEMENT BONE GENTAMICIN 40 (Cement) IMPLANT
COOLER ICEMAN CLASSIC (MISCELLANEOUS) ×1 IMPLANT
CUFF TRNQT CYL 24X4X16.5-23 (TOURNIQUET CUFF) IMPLANT
CUFF TRNQT CYL 30X4X21-28X (TOURNIQUET CUFF) IMPLANT
DRAPE SHEET LG 3/4 BI-LAMINATE (DRAPES) ×1 IMPLANT
DRSG AQUACEL AG ADV 3.5X14 (GAUZE/BANDAGES/DRESSINGS) ×1 IMPLANT
DRSG MEPILEX SACRM 8.7X9.8 (GAUZE/BANDAGES/DRESSINGS) ×1 IMPLANT
DRSG TEGADERM 4X4.75 (GAUZE/BANDAGES/DRESSINGS) ×1 IMPLANT
DURAPREP 26ML APPLICATOR (WOUND CARE) ×2 IMPLANT
ELECT CAUTERY BLADE 6.4 (BLADE) ×1 IMPLANT
ELECTRODE REM PT RTRN 9FT ADLT (ELECTROSURGICAL) ×1 IMPLANT
EVACUATOR 1/8 PVC DRAIN (DRAIN) ×1 IMPLANT
EX-PIN ORTHOLOCK NAV 4X150 (PIN) ×2 IMPLANT
GAUZE XEROFORM 1X8 LF (GAUZE/BANDAGES/DRESSINGS) ×1 IMPLANT
GLOVE BIOGEL M STRL SZ7.5 (GLOVE) ×6 IMPLANT
GLOVE BIOGEL PI IND STRL 8 (GLOVE) ×1 IMPLANT
GLOVE SRG 8 PF TXTR STRL LF DI (GLOVE) ×1 IMPLANT
GOWN STRL REUS W/ TWL LRG LVL3 (GOWN DISPOSABLE) ×1 IMPLANT
GOWN STRL REUS W/ TWL XL LVL3 (GOWN DISPOSABLE) ×1 IMPLANT
GOWN TOGA ZIPPER T7+ PEEL AWAY (MISCELLANEOUS) ×1 IMPLANT
HOLDER FOLEY CATH W/STRAP (MISCELLANEOUS) ×1 IMPLANT
HOOD PEEL AWAY T7 (MISCELLANEOUS) ×1 IMPLANT
KIT TURNOVER KIT A (KITS) ×1 IMPLANT
KNIFE SCULPS 14X20 (INSTRUMENTS) ×1 IMPLANT
MANIFOLD NEPTUNE II (INSTRUMENTS) ×2 IMPLANT
NDL SPNL 20GX3.5 QUINCKE YW (NEEDLE) ×2 IMPLANT
NEEDLE SPNL 20GX3.5 QUINCKE YW (NEEDLE) ×3 IMPLANT
PACK TOTAL KNEE (MISCELLANEOUS) ×1 IMPLANT
PAD ABD DERMACEA PRESS 5X9 (GAUZE/BANDAGES/DRESSINGS) ×2 IMPLANT
PAD ARMBOARD POSITIONER FOAM (MISCELLANEOUS) ×3 IMPLANT
PAD COLD UNI WRAP-ON (PAD) ×1 IMPLANT
PENCIL SMOKE EVACUATOR COATED (MISCELLANEOUS) ×1 IMPLANT
PIN DRILL FIX HALF THREAD (BIT) ×2 IMPLANT
PIN FIXATION 1/8DIA X 3INL (PIN) ×1 IMPLANT
SOL .9 NS 3000ML IRR UROMATIC (IV SOLUTION) ×1 IMPLANT
SOLUTION IRRIG SURGIPHOR (IV SOLUTION) ×1 IMPLANT
SPONGE DRAIN TRACH 4X4 STRL 2S (GAUZE/BANDAGES/DRESSINGS) ×1 IMPLANT
STAPLER SKIN PROX 35W (STAPLE) ×1 IMPLANT
STOCKINETTE IMPERV 14X48 (MISCELLANEOUS) ×1 IMPLANT
STOCKINETTE STRL BIAS CUT 8X4 (MISCELLANEOUS) ×1 IMPLANT
STRAP TIBIA SHORT (MISCELLANEOUS) ×1 IMPLANT
SUCTION TUBE FRAZIER 10FR DISP (SUCTIONS) ×1 IMPLANT
SUT VIC AB 0 CT1 36 (SUTURE) ×1 IMPLANT
SUT VIC AB 1 CT1 36 (SUTURE) ×2 IMPLANT
SUT VIC AB 2-0 CT2 27 (SUTURE) ×1 IMPLANT
SYR 30ML LL (SYRINGE) ×2 IMPLANT
TIP FAN IRRIG PULSAVAC PLUS (DISPOSABLE) ×1 IMPLANT
TOWEL OR 17X26 4PK STRL BLUE (TOWEL DISPOSABLE) IMPLANT
TOWER CARTRIDGE SMART MIX (DISPOSABLE) ×1 IMPLANT
TRAP FLUID SMOKE EVACUATOR (MISCELLANEOUS) ×1 IMPLANT
TRAY FOLEY MTR SLVR 16FR STAT (SET/KITS/TRAYS/PACK) ×1 IMPLANT
WATER STERILE IRR 1000ML POUR (IV SOLUTION) ×1 IMPLANT

## 2023-10-11 NOTE — Anesthesia Postprocedure Evaluation (Signed)
 Anesthesia Post Note  Patient: Marissa Roman  Procedure(s) Performed: ARTHROPLASTY, KNEE, TOTAL, USING IMAGELESS COMPUTER-ASSISTED NAVIGATION (Right: Knee)  Patient location during evaluation: PACU Anesthesia Type: General Level of consciousness: awake and alert, oriented and patient cooperative Pain management: pain level controlled Vital Signs Assessment: post-procedure vital signs reviewed and stable Respiratory status: spontaneous breathing, nonlabored ventilation and respiratory function stable Cardiovascular status: blood pressure returned to baseline and stable Postop Assessment: adequate PO intake Anesthetic complications: no   No notable events documented.   Last Vitals:  Vitals:   10/11/23 1600 10/11/23 1615  BP: 109/71 124/65  Pulse: 73 66  Resp: (!) 22 20  Temp:    SpO2: (!) 88% 91%    Last Pain:  Vitals:   10/11/23 1615  TempSrc:   PainSc: Asleep                 Alfonso Ruths

## 2023-10-11 NOTE — Plan of Care (Signed)
  Problem: Safety: Goal: Ability to remain free from injury will improve Outcome: Progressing   Problem: Pain Management: Goal: Pain level will decrease with appropriate interventions Outcome: Progressing

## 2023-10-11 NOTE — Anesthesia Procedure Notes (Signed)
 Procedure Name: Intubation Date/Time: 10/11/2023 11:00 AM  Performed by: Veronica Alm BROCKS, CRNAPre-anesthesia Checklist: Patient identified, Emergency Drugs available, Suction available and Patient being monitored Patient Re-evaluated:Patient Re-evaluated prior to induction Oxygen Delivery Method: Circle system utilized Preoxygenation: Pre-oxygenation with 100% oxygen Induction Type: IV induction Ventilation: Mask ventilation without difficulty Laryngoscope Size: 4 and McGrath Grade View: Grade I Tube type: Oral Tube size: 7.5 mm Number of attempts: 1 Airway Equipment and Method: Stylet and Oral airway Placement Confirmation: ETT inserted through vocal cords under direct vision, positive ETCO2 and breath sounds checked- equal and bilateral Secured at: 21 cm Tube secured with: Tape Dental Injury: Teeth and Oropharynx as per pre-operative assessment

## 2023-10-11 NOTE — Anesthesia Preprocedure Evaluation (Addendum)
 Anesthesia Evaluation  Patient identified by MRN, date of birth, ID band Patient awake    Reviewed: Allergy & Precautions, NPO status , Patient's Chart, lab work & pertinent test results  History of Anesthesia Complications Negative for: history of anesthetic complications  Airway Mallampati: III  TM Distance: <3 FB Neck ROM: full    Dental  (+) Chipped   Pulmonary neg pulmonary ROS, neg shortness of breath   Pulmonary exam normal        Cardiovascular Exercise Tolerance: Good hypertension, (-) angina (-) Past MI Normal cardiovascular exam     Neuro/Psych negative neurological ROS  negative psych ROS   GI/Hepatic negative GI ROS, Neg liver ROS,neg GERD  ,,  Endo/Other  negative endocrine ROS    Renal/GU      Musculoskeletal   Abdominal   Peds  Hematology negative hematology ROS (+)   Anesthesia Other Findings Past Medical History: No date: Anemia     Comment:  in young age No date: Anxiety 06/2023: Arthritis     Comment:  Degenerative arthrosis of the right knee No date: Heart murmur     Comment:  at birth No date: Hyperlipidemia No date: Hypertension  Past Surgical History: No date: ARTHROSCOPIC REPAIR ACL; Left 03/09/2023: COLONOSCOPY WITH PROPOFOL ; N/A     Comment:  Procedure: COLONOSCOPY WITH PROPOFOL ;  Surgeon: Unk Corinn Skiff, MD;  Location: [facility] ENDOSCOPY;  Service:               Gastroenterology;  Laterality: N/A; 2005: LAPAROSCOPIC SUPRACERVICAL HYSTERECTOMY 03/09/2023: POLYPECTOMY     Comment:  Procedure: POLYPECTOMY;  Surgeon: Unk Corinn Skiff,               MD;  Location: [facility] ENDOSCOPY;  Service:               Gastroenterology;;  BMI    Body Mass Index: 42.99 kg/m      Reproductive/Obstetrics negative OB ROS                              Anesthesia Physical Anesthesia Plan  ASA: 3  Anesthesia Plan: General ETT   Post-op Pain  Management:    Induction: Intravenous  PONV Risk Score and Plan: Ondansetron , Dexamethasone , Midazolam  and Treatment may vary due to age or medical condition  Airway Management Planned: Oral ETT  Additional Equipment:   Intra-op Plan:   Post-operative Plan: Extubation in OR  Informed Consent: I have reviewed the patients History and Physical, chart, labs and discussed the procedure including the risks, benefits and alternatives for the proposed anesthesia with the patient or authorized representative who has indicated his/her understanding and acceptance.     Dental Advisory Given  Plan Discussed with: Anesthesiologist, CRNA and Surgeon  Anesthesia Plan Comments: (Patient declines spinal and request GA  Patient consented for risks of anesthesia including but not limited to:  - adverse reactions to medications - damage to eyes, teeth, lips or other oral mucosa - nerve damage due to positioning  - sore throat or hoarseness - Damage to heart, brain, nerves, lungs, other parts of body or loss of life  Patient voiced understanding and assent.)         Anesthesia Quick Evaluation

## 2023-10-11 NOTE — Progress Notes (Signed)
 Patient is not able to walk the distance required to go the bathroom, or he/she is unable to safely negotiate stairs required to access the bathroom.  A 3in1 BSC will alleviate this problem   Amenda Duclos P. Angie Fava M.D.

## 2023-10-11 NOTE — Transfer of Care (Signed)
 Immediate Anesthesia Transfer of Care Note  Patient: Marissa Roman  Procedure(s) Performed: ARTHROPLASTY, KNEE, TOTAL, USING IMAGELESS COMPUTER-ASSISTED NAVIGATION (Right: Knee)  Patient Location: PACU  Anesthesia Type:General  Level of Consciousness: drowsy  Airway & Oxygen Therapy: Patient Spontanous Breathing and Patient connected to face mask oxygen  Post-op Assessment: Report given to RN, Post -op Vital signs reviewed and stable, and Patient moving all extremities X 4  Post vital signs: Reviewed and stable  Last Vitals:  Vitals Value Taken Time  BP 99/50 10/11/23 15:35  Temp 36.5 C 10/11/23 15:35  Pulse 71 10/11/23 15:35  Resp 15 10/11/23 15:35  SpO2 95 % 10/11/23 15:35  Vitals shown include unfiled device data.  Last Pain:  Vitals:   10/11/23 0912  TempSrc: Temporal  PainSc: 0-No pain         Complications: No notable events documented.

## 2023-10-11 NOTE — Op Note (Signed)
 OPERATIVE NOTE  DATE OF SURGERY:  10/11/2023  PATIENT NAME:  Marissa Roman   DOB: May 02, 1960  MRN: [id number]  PRE-OPERATIVE DIAGNOSIS: Degenerative arthrosis of the right knee, primary  POST-OPERATIVE DIAGNOSIS:  Same  PROCEDURE:  Right total knee arthroplasty using computer-assisted navigation  SURGEON:  Lynwood SHAUNNA Mardee Mickey. M.D.  ASSISTANT:  Sidra Koyanagi, PA-C (present and scrubbed throughout the case, critical for assistance with exposure, retraction, instrumentation, and closure)  ANESTHESIA: general  ESTIMATED BLOOD LOSS: 50 mL  FLUIDS REPLACED: 1600 mL of crystalloid  TOURNIQUET TIME: 112 minutes  DRAINS: 2 medium Hemovac drains  SOFT TISSUE RELEASES: Anterior cruciate ligament, posterior cruciate ligament, deep medial collateral ligament, patellofemoral ligament  IMPLANTS UTILIZED: DePuy Attune size 7 posterior stabilized femoral component (cemented), size 5 rotating platform tibial component (cemented), 41 mm medialized dome patella (cemented), and a 5 mm stabilized rotating platform polyethylene insert.  INDICATIONS FOR SURGERY: Marissa Roman is a 63 y.o. year old female with a long history of progressive knee pain. X-rays demonstrated severe degenerative changes in tricompartmental fashion. The patient had not seen any significant improvement despite conservative nonsurgical intervention. After discussion of the risks and benefits of surgical intervention, the patient expressed understanding of the risks benefits and agree with plans for total knee arthroplasty.   The risks, benefits, and alternatives were discussed at length including but not limited to the risks of infection, bleeding, nerve injury, stiffness, blood clots, the need for revision surgery, cardiopulmonary complications, among others, and they were willing to proceed.  PROCEDURE IN DETAIL: The patient was brought into the operating room and, after adequate general anesthesia was achieved, a tourniquet was placed  on the patient's upper thigh. The patient's knee and leg were cleaned and prepped with alcohol and DuraPrep and draped in the usual sterile fashion. A timeout was performed as per usual protocol. The lower extremity was exsanguinated using an Esmarch, and the tourniquet was inflated to 300 mmHg. An anterior longitudinal incision was made followed by a standard mid vastus approach. The deep fibers of the medial collateral ligament were elevated in a subperiosteal fashion off of the medial flare of the tibia so as to maintain a continuous soft tissue sleeve. The patella was subluxed laterally and the patellofemoral ligament was incised. Inspection of the knee demonstrated severe degenerative changes with full-thickness loss of articular cartilage. Osteophytes were debrided using a rongeur. Anterior and posterior cruciate ligaments were excised. Two 4.0 mm Schanz pins were inserted in the femur and into the tibia for attachment of the array of trackers used for computer-assisted navigation. Hip center was identified using a circumduction technique. Distal landmarks were mapped using the computer. The distal femur and proximal tibia were mapped using the computer. The distal femoral cutting guide was positioned using computer-assisted navigation so as to achieve a 5 distal valgus cut. The femur was sized and it was felt that a size 7 femoral component was appropriate. A size 7 femoral cutting guide was positioned and the anterior cut was performed and verified using the computer. This was followed by completion of the posterior and chamfer cuts. Femoral cutting guide for the central box was then positioned in the center box cut was performed.  Attention was then directed to the proximal tibia. Medial and lateral menisci were excised. The extramedullary tibial cutting guide was positioned using computer-assisted navigation so as to achieve a 0 varus-valgus alignment and 3 posterior slope. The cut was performed and  verified using the computer. The proximal tibia was sized  and it was felt that a size 5 tibial tray was appropriate. Tibial and femoral trials were inserted followed by insertion of a 5 mm polyethylene insert. This allowed for excellent mediolateral soft tissue balancing both in flexion and in full extension. Finally, the patella was cut and prepared so as to accommodate a 41 mm medialized dome patella. A patella trial was placed and the knee was placed through a range of motion with excellent patellar tracking appreciated. The femoral trial was removed after debridement of posterior osteophytes. The central post-hole for the tibial component was reamed followed by insertion of a keel punch. Tibial trials were then removed. Cut surfaces of bone were irrigated with copious amounts of normal saline using pulsatile lavage and then suctioned dry. Polymethylmethacrylate cement with gentamicin was prepared in the usual fashion using a vacuum mixer. Cement was applied to the cut surface of the proximal tibia as well as along the undersurface of a size 5 rotating platform tibial component. Tibial component was positioned and impacted into place. Excess cement was removed using Personal assistant. Cement was then applied to the cut surfaces of the femur as well as along the posterior flanges of the size 7 femoral component. The femoral component was positioned and impacted into place. Excess cement was removed using Personal assistant. A 5 mm polyethylene trial was inserted and the knee was brought into full extension with steady axial compression applied. Finally, cement was applied to the backside of a 41 mm medialized dome patella and the patellar component was positioned and patellar clamp applied. Excess cement was removed using Personal assistant. After adequate curing of the cement, the tourniquet was deflated after a total tourniquet time of 112 minutes. Hemostasis was achieved using electrocautery. The knee was irrigated  with copious amounts of normal saline using pulsatile lavage followed by 450 ml of Surgiphor and then suctioned dry. 20 mL of 1.3% Exparel  and 60 mL of 0.25% Marcaine  in 40 mL of normal saline was injected along the posterior capsule, medial and lateral gutters, and along the arthrotomy site. A 5 mm stabilized rotating platform polyethylene insert was inserted and the knee was placed through a range of motion with excellent mediolateral soft tissue balancing appreciated and excellent patellar tracking noted. 2 medium drains were placed in the wound bed and brought out through separate stab incisions. The medial parapatellar portion of the incision was reapproximated using interrupted sutures of #1 Vicryl. Subcutaneous tissue was approximated in layers using first #0 Vicryl followed #2-0 Vicryl. The skin was approximated with skin staples. A sterile dressing was applied.  The patient tolerated the procedure well and was transported to the recovery room in stable condition.    Female Minish P. Adaria Hole, Jr., M.D.

## 2023-10-11 NOTE — Interval H&P Note (Signed)
 History and Physical Interval Note:  10/11/2023 10:23 AM  Marissa Roman  has presented today for surgery, with the diagnosis of Primary osteoarthritis of right knee.  The various methods of treatment have been discussed with the patient and family. After consideration of risks, benefits and other options for treatment, the patient has consented to  Procedure(s): ARTHROPLASTY, KNEE, TOTAL, USING IMAGELESS COMPUTER-ASSISTED NAVIGATION (Right) as a surgical intervention.  The patient's history has been reviewed, patient examined, no change in status, stable for surgery.  I have reviewed the patient's chart and labs.  Questions were answered to the patient's satisfaction.     Cyd Hostler P Safiyyah Vasconez

## 2023-10-11 NOTE — Discharge Summary (Signed)
 Physician Discharge Summary  Subjective: 1 Day Post-Op Procedure(s) (LRB): ARTHROPLASTY, KNEE, TOTAL, USING IMAGELESS COMPUTER-ASSISTED NAVIGATION (Right) Patient reports pain as mild.   Patient seen in rounds with Dr. Mardee. Patient is well, and has had no acute complaints or problems Denies any CP, SOB, N/V, fevers or chills We will start therapy today.  Patient is ready to go home  Physician Discharge Summary  Patient ID: Marissa Roman MRN: [id number] DOB/AGE: 1960-10-29 63 y.o.  Admit date: 10/11/2023 Discharge date: 10/12/2023  Admission Diagnoses:  Discharge Diagnoses:  Principal Problem:   History of total knee arthroplasty, right   Discharged Condition: good  Hospital Course: Patient presented to the hospital on 10/11/2023 for an elective right total knee arthroplasty performed by Dr. Mardee. Patient was given 1g of TXA and 3g of Ancef  prior to the procedure. she tolerated the procedure well without any complications. See procedural note below for details. Postoperatively, the patient did very well. she was able to pass PT protocols on post-op day one without any issues. JP drain was removed without any difficulty and was intact. she was able to void her bladder without any difficulty. Physical exam was unremarkable. she denies any SOB, CP, N/V, fevers or chills. Vital signs are stable. Patient is stable to discharge home.  PROCEDURE:  Right total knee arthroplasty using computer-assisted navigation   SURGEON:  Lynwood SHAUNNA Mardee Mickey. M.D.   ASSISTANT:  Sidra Koyanagi, PA-C (present and scrubbed throughout the case, critical for assistance with exposure, retraction, instrumentation, and closure)   ANESTHESIA: general   ESTIMATED BLOOD LOSS: 50 mL   FLUIDS REPLACED: 1600 mL of crystalloid   TOURNIQUET TIME: 112 minutes   DRAINS: 2 medium Hemovac drains   SOFT TISSUE RELEASES: Anterior cruciate ligament, posterior cruciate ligament, deep medial collateral ligament,  patellofemoral ligament   IMPLANTS UTILIZED: DePuy Attune size 7 posterior stabilized femoral component (cemented), size 5 rotating platform tibial component (cemented), 41 mm medialized dome patella (cemented), and a 5 mm stabilized rotating platform polyethylene insert.  Treatments: none  Discharge Exam: Blood pressure (!) 141/73, pulse 70, temperature 98.2 F (36.8 C), temperature source Oral, resp. rate 15, height 5' 10 (1.778 m), weight 135.9 kg, SpO2 97%.   Disposition: home   Allergies as of 10/12/2023   No Known Allergies      Medication List     TAKE these medications    amLODipine  10 MG tablet Commonly known as: NORVASC  Take 1 tablet (10 mg total) by mouth daily.   aspirin  EC 81 MG tablet Commonly known as: Aspirin  81 Take 1 tablet (81 mg total) by mouth 2 (two) times daily. What changed:  medication strength how much to take when to take this   atorvastatin  10 MG tablet Commonly known as: LIPITOR Take 1 tablet (10 mg total) by mouth daily.   Blood Pressure Cuff Misc 1 each by Does not apply route daily.   celecoxib  200 MG capsule Commonly known as: CELEBREX  Take 1 capsule (200 mg total) by mouth 2 (two) times daily.   multivitamin capsule Take 1 capsule by mouth daily.   oxyCODONE  5 MG immediate release tablet Commonly known as: Oxy IR/ROXICODONE  Take 1 tablet (5 mg total) by mouth every 4 (four) hours as needed for moderate pain (pain score 4-6) (pain score 4-6).   traMADol  50 MG tablet Commonly known as: ULTRAM  Take 1-2 tablets (50-100 mg total) by mouth every 4 (four) hours as needed for moderate pain (pain score 4-6).  Durable Medical Equipment  (From admission, onward)           Start     Ordered   10/11/23 1714  DME Walker rolling  Once       Question:  Patient needs a walker to treat with the following condition  Answer:  Total knee replacement status   10/11/23 1714   10/11/23 1714  DME Bedside commode  Once        Comments: Patient is not able to walk the distance required to go the bathroom, or he/she is unable to safely negotiate stairs required to access the bathroom.  A 3in1 BSC will alleviate this problem  Question:  Patient needs a bedside commode to treat with the following condition  Answer:  Total knee replacement status   10/11/23 1714            Follow-up Information     Drake Chew, PA-C Follow up on 10/26/2023.   Specialty: Orthopedic Surgery Why: at 1:45pm Contact information: [address] [place] [address] [phone]         Mardee Lynwood SQUIBB, MD Follow up on 11/23/2023.   Specialty: Orthopedic Surgery Why: at 2:45pm Contact information: [address] [facility] [place] [address] [phone]                 Signed: Sidra Drake 10/12/2023, 8:40 AM   Objective: Vital signs in last 24 hours: Temp:  [97 F (36.1 C)-98.2 F (36.8 C)] 98.2 F (36.8 C) (09/18 0723) Pulse Rate:  [63-95] 70 (09/18 0723) Resp:  [11-22] 15 (09/18 0723) BP: (99-143)/(50-85) 141/73 (09/18 0723) SpO2:  [88 %-100 %] 97 % (09/18 0723) Weight:  [135.9 kg] 135.9 kg (09/17 0912)  Intake/Output from previous day:  Intake/Output Summary (Last 24 hours) at 10/12/2023 0840 Last data filed at 10/12/2023 0235 Gross per 24 hour  Intake 3186.17 ml  Output 1260 ml  Net 1926.17 ml    Intake/Output this shift: No intake/output data recorded.  Labs: No results for input(s): HGB in the last 72 hours. No results for input(s): WBC, RBC, HCT, PLT in the last 72 hours. No results for input(s): NA, K, CL, CO2, BUN, CREATININE, GLUCOSE, CALCIUM  in the last 72 hours. No results for input(s): LABPT, INR in the last 72 hours.  EXAM: General - Patient is Alert, Appropriate, and Oriented Extremity - Neurologically intact Neurovascular intact Sensation intact distally Intact pulses distally Dorsiflexion/Plantar flexion  intact No cellulitis present Compartment soft Dressing - dressing C/D/I and no drainage Motor Function - intact, moving foot and toes well on exam. JP Drain pulled without difficulty. Intact  Assessment/Plan: 1 Day Post-Op Procedure(s) (LRB): ARTHROPLASTY, KNEE, TOTAL, USING IMAGELESS COMPUTER-ASSISTED NAVIGATION (Right) Procedure(s) (LRB): ARTHROPLASTY, KNEE, TOTAL, USING IMAGELESS COMPUTER-ASSISTED NAVIGATION (Right) Past Medical History:  Diagnosis Date   Anemia    in young age   Anxiety    Arthritis 06/2023   Degenerative arthrosis of the right knee   Heart murmur    at birth   Hyperlipidemia    Hypertension    Principal Problem:   History of total knee arthroplasty, right  Estimated body mass index is 42.99 kg/m as calculated from the following:   Height as of this encounter: 5' 10 (1.778 m).   Weight as of this encounter: 135.9 kg.  Patient will continue to work with physical therapy to pass postoperative PT protocols, ROM and strengthening   Discussed with the patient continuing to utilize [facility]  Patient will use bone foam in 20-30 minute intervals   Patient will wear TED hose bilaterally to help prevent DVT and clot formation   Discussed the Aquacel bandage.  This bandage will stay in place 7 days postoperatively.  Can be replaced with honeycomb bandages that will be sent home with the patient   Discussed sending the patient home with tramadol  and oxycodone  for as needed pain management.  Patient will also be sent home with Celebrex  to help with swelling and inflammation.  Patient will take an 81 mg aspirin  twice daily for DVT prophylaxis   JP drain removed without difficulty, intact   Weight-Bearing as tolerated to right leg   Patient will follow-up with [facility] orthopedics in 2 weeks for staple removal and reevaluation  Diet - Regular diet Follow up - in 2 weeks Activity - WBAT Disposition - Home Condition Upon Discharge - Good DVT  Prophylaxis - Aspirin  and TED hose  Fonda CHARLENA Koyanagi, PA-C Orthopaedic Surgery 10/12/2023, 8:40 AM

## 2023-10-11 NOTE — Progress Notes (Signed)
 PT Cancellation Note  Patient Details Name: Marissa Roman MRN: [id number] DOB: January 05, 1961   Cancelled Treatment:    Reason Eval/Treat Not Completed: Patient's level of consciousness;Other (comment) Visited pt in PACU ~1700.  She was awake but still quite drowsy and (correctly) said she wasn't ready to try getting up yet.  Nursing endorses heavy med load at this point POD0.  Will plan to initiate PT eval tomorrow AM per [facility] protocols.  Carmin JONELLE Deed, DPT 10/11/2023, 5:20 PM

## 2023-10-11 NOTE — Progress Notes (Signed)
 Subjective: 1 Day Post-Op Procedure(s) (LRB): ARTHROPLASTY, KNEE, TOTAL, USING IMAGELESS COMPUTER-ASSISTED NAVIGATION (Right) Patient reports pain as mild.   Patient seen in rounds with Dr. Mardee. Patient is well, and has had no acute complaints or problems Denies any CP, SOB, N/V, fevers or chills We will start therapy today.  Plan is to go Home after hospital stay.  Objective: Vital signs in last 24 hours: Temp:  [97 F (36.1 C)-98.2 F (36.8 C)] 98.2 F (36.8 C) (09/18 0723) Pulse Rate:  [63-95] 70 (09/18 0723) Resp:  [11-22] 15 (09/18 0723) BP: (99-143)/(50-85) 141/73 (09/18 0723) SpO2:  [88 %-100 %] 97 % (09/18 0723) Weight:  [135.9 kg] 135.9 kg (09/17 0912)  Intake/Output from previous day:  Intake/Output Summary (Last 24 hours) at 10/12/2023 0838 Last data filed at 10/12/2023 0235 Gross per 24 hour  Intake 3186.17 ml  Output 1260 ml  Net 1926.17 ml    Intake/Output this shift: No intake/output data recorded.  Labs: No results for input(s): HGB in the last 72 hours. No results for input(s): WBC, RBC, HCT, PLT in the last 72 hours. No results for input(s): NA, K, CL, CO2, BUN, CREATININE, GLUCOSE, CALCIUM  in the last 72 hours. No results for input(s): LABPT, INR in the last 72 hours.  EXAM General - Patient is Alert, Appropriate, and Oriented Extremity - Neurologically intact Neurovascular intact Sensation intact distally Intact pulses distally Dorsiflexion/Plantar flexion intact No cellulitis present Compartment soft Dressing - dressing C/D/I and no drainage Motor Function - intact, moving foot and toes well on exam. JP Drain pulled without difficulty. Intact  Past Medical History:  Diagnosis Date   Anemia    in young age   Anxiety    Arthritis 06/2023   Degenerative arthrosis of the right knee   Heart murmur    at birth   Hyperlipidemia    Hypertension     Assessment/Plan: 1 Day Post-Op Procedure(s)  (LRB): ARTHROPLASTY, KNEE, TOTAL, USING IMAGELESS COMPUTER-ASSISTED NAVIGATION (Right) Principal Problem:   History of total knee arthroplasty, right  Estimated body mass index is 42.99 kg/m as calculated from the following:   Height as of this encounter: 5' 10 (1.778 m).   Weight as of this encounter: 135.9 kg. Advance diet Up with therapy  Patient will continue to work with physical therapy to pass postoperative PT protocols, ROM and strengthening  Discussed with the patient continuing to utilize Polar Care  Patient will use bone foam in 20-30 minute intervals  Patient will wear TED hose bilaterally to help prevent DVT and clot formation  Discussed the Aquacel bandage.  This bandage will stay in place 7 days postoperatively.  Can be replaced with honeycomb bandages that will be sent home with the patient  Discussed sending the patient home with tramadol  and oxycodone  for as needed pain management.  Patient will also be sent home with Celebrex  to help with swelling and inflammation.  Patient will take an 81 mg aspirin  twice daily for DVT prophylaxis  JP drain removed without difficulty, intact  Weight-Bearing as tolerated to right leg  Patient will follow-up with [facility] in 2 weeks for staple removal and reevaluation  Fonda Koyanagi, PA-C [facility] 10/12/2023, 8:38 AM\

## 2023-10-12 ENCOUNTER — Other Ambulatory Visit: Payer: Self-pay

## 2023-10-12 ENCOUNTER — Encounter: Payer: Self-pay | Admitting: Orthopedic Surgery

## 2023-10-12 DIAGNOSIS — M1711 Unilateral primary osteoarthritis, right knee: Secondary | ICD-10-CM | POA: Diagnosis not present

## 2023-10-12 MED ORDER — CELECOXIB 200 MG PO CAPS
200.0000 mg | ORAL_CAPSULE | Freq: Two times a day (BID) | ORAL | 1 refills | Status: AC
  Filled 2023-10-12: qty 60, 30d supply, fill #0

## 2023-10-12 MED ORDER — TRAMADOL HCL 50 MG PO TABS
50.0000 mg | ORAL_TABLET | ORAL | 0 refills | Status: AC | PRN
  Filled 2023-10-12: qty 30, 7d supply, fill #0

## 2023-10-12 MED ORDER — ASPIRIN 81 MG PO TBEC: 81.0000 mg | DELAYED_RELEASE_TABLET | Freq: Two times a day (BID) | ORAL | Status: AC

## 2023-10-12 MED ORDER — OXYCODONE HCL 5 MG PO TABS
5.0000 mg | ORAL_TABLET | ORAL | 0 refills | Status: AC | PRN
  Filled 2023-10-12: qty 30, 5d supply, fill #0

## 2023-10-12 NOTE — Evaluation (Signed)
 Physical Therapy Evaluation Patient Details Name: Marissa Roman MRN: [id number] DOB: [birth year] Today's Date: 10/12/2023  History of Present Illness  Pt is a 63 y.o. female who underwent an elective right total knee arthroplasty on 10/11/2023.  Clinical Impression  Pt did well with PT exam and treat, she showed good effort with all tasks and despite some expected stiffness and pain still did very well.  She showed ability to do SLRs, had >90 AROM flexion, showed good gait safety and confidence and negotiate up/down 4 steps w/o physical assist apart from walker management going up steps backward.  Pt overall did well, eager to get back home.  Pt will benefit from continued PT to address functional limitations per TKA protocols.       If plan is discharge home, recommend the following: Assist for transportation;Assistance with cooking/housework;Help with stairs or ramp for entrance   Can travel by private vehicle        Equipment Recommendations Rolling walker (2 wheels);BSC/3in1 (bari)  Recommendations for Other Services       Functional Status Assessment Patient has had a recent decline in their functional status and demonstrates the ability to make significant improvements in function in a reasonable and predictable amount of time.     Precautions / Restrictions Precautions Precautions: Fall Precaution/Restrictions Comments: Fall Restrictions Weight Bearing Restrictions Per Provider Order: Yes RUE Weight Bearing Per Provider Order: Weight bearing as tolerated      Mobility  Bed Mobility Overal bed mobility: Needs Assistance Bed Mobility: Supine to Sit, Sit to Supine     Supine to sit: HOB elevated, Used rails, Contact guard Sit to supine: HOB elevated, Used rails, Contact guard assist   General bed mobility comments: Pt did not need direct assist to get to sitting EOB, heavy UEs and extra time    Transfers Overall transfer level: Needs assistance Equipment used: Rolling  walker (2 wheels) Transfers: Sit to/from Stand, Bed to chair/wheelchair/BSC Sit to Stand: Contact guard assist           General transfer comment: Cuing for UE use/LE set up, heavy forward lean but able to rise from standard height bed w/o phyiscal assist    Ambulation/Gait Ambulation/Gait assistance: Supervision Gait Distance (Feet): 200 Feet Assistive device: Rolling walker (2 wheels)         General Gait Details: Pt was able to improve speed, symmetry, cadence and confidence with increased distance and consistent cuing.  Appropriate UE reliance - no rest breaks needed for prolonged upright  Stairs Stairs: Yes Stairs assistance: Supervision Stair Management: No rails, Backwards, With walker Number of Stairs: 4 General stair comments: Pt was able to perform and understand concept of retro negotiation with walker.  Wheelchair Mobility     Tilt Bed    Modified Rankin (Stroke Patients Only)       Balance Overall balance assessment: Needs assistance   Sitting balance-Leahy Scale: Normal       Standing balance-Leahy Scale: Good Standing balance comment: appropriate walker use                             Pertinent Vitals/Pain Pain Assessment Pain Assessment: 0-10 Pain Score: 4  Pain Location: expected post-op pain    Home Living Family/patient expects to be discharged to:: Private residence Living Arrangements: Alone;Other (Comment) (will stay with family 1-2 weeks) Available Help at Discharge: Family Type of Home:  (mother and sister) Home Access: Stairs to enter Entrance Stairs-Rails:  None Entrance Stairs-Number of Steps: 2 (more stairs to deal with when she goes home) Alternate Level Stairs-Number of Steps: Will discharge to mother's house, however will have access to a 1st floor set-up. Home Layout: Two level;Able to live on main level with bedroom/bathroom Home Equipment: None (borrowed old walker) Additional Comments: Will discharge to  mother's house, however will have access to a 1st floor set-up.    Prior Function Prior Level of Function : Independent/Modified Independent             Mobility Comments: Independent to Mod I with ambulation, reports using cane ADLs Comments: Independent to Mod I with ADLs/IADLs, still works and drives     Extremity/Trunk Assessment   Upper Extremity Assessment Upper Extremity Assessment: Overall WFL for tasks assessed    Lower Extremity Assessment Lower Extremity Assessment: Overall WFL for tasks assessed (expected post-op weakness, able to SLR)       Communication   Communication Communication: No apparent difficulties    Cognition Arousal: Alert Behavior During Therapy: WFL for tasks assessed/performed                             Following commands: Intact       Cueing Cueing Techniques: Verbal cues     General Comments      Exercises Total Joint Exercises Ankle Circles/Pumps: AROM, 15 reps Quad Sets: Strengthening, 10 reps Heel Slides: AROM, 10 reps (with resisted leg ext) Hip ABduction/ADduction: AROM, 10 reps Straight Leg Raises: AROM, 10 reps Knee Flexion: PROM, 5 reps Goniometric ROM: AROM 1-95 (PROM 0-101)   Assessment/Plan    PT Assessment Patient needs continued PT services  PT Problem List Decreased strength;Decreased range of motion;Decreased activity tolerance;Decreased balance;Decreased mobility;Decreased safety awareness;Pain       PT Treatment Interventions DME instruction;Gait training;Stair training;Therapeutic activities;Functional mobility training;Therapeutic exercise;Balance training    PT Goals (Current goals can be found in the Care Plan section)  Acute Rehab PT Goals Patient Stated Goal: Go home today PT Goal Formulation: With patient Time For Goal Achievement: 10/25/23 Potential to Achieve Goals: Good    Frequency BID     Co-evaluation               AM-PAC PT 6 Clicks Mobility  Outcome Measure  Help needed turning from your back to your side while in a flat bed without using bedrails?: None Help needed moving from lying on your back to sitting on the side of a flat bed without using bedrails?: None Help needed moving to and from a bed to a chair (including a wheelchair)?: A Little Help needed standing up from a chair using your arms (e.g., wheelchair or bedside chair)?: A Little Help needed to walk in hospital room?: A Little Help needed climbing 3-5 steps with a railing? : A Little 6 Click Score: 20    End of Session Equipment Utilized During Treatment: Gait belt Activity Tolerance: Patient tolerated treatment well Patient left: in chair;with call bell/phone within reach;with family/visitor present Nurse Communication: Mobility status PT Visit Diagnosis: Muscle weakness (generalized) (M62.81);Difficulty in walking, not elsewhere classified (R26.2);Pain Pain - Right/Left: Right Pain - part of body: Knee    Time: 9041-8964 PT Time Calculation (min) (ACUTE ONLY): 37 min   Charges:   PT Evaluation $PT Eval Low Complexity: 1 Low PT Treatments $Gait Training: 8-22 mins $Therapeutic Exercise: 8-22 mins PT General Charges $$ ACUTE PT VISIT: 1 Visit  Carmin JONELLE Deed, DPT 10/12/2023, 11:24 AM

## 2023-10-12 NOTE — Plan of Care (Signed)
   Problem: Education: Goal: Knowledge of General Education information will improve Description Including pain rating scale, medication(s)/side effects and non-pharmacologic comfort measures Outcome: Progressing

## 2023-10-12 NOTE — Progress Notes (Signed)
 DISCHARGE NOTE:  Pt dc with IV removed and dc instructions given. Pt received RW and 3 in 1 to hospital room. Pt also received medications delivered to bedside. Pt voices no questions or concerns at this time. Pt wheeled down to medical mall entrance by staff. Pt's mom provided transportation.

## 2023-10-12 NOTE — Evaluation (Signed)
 Occupational Therapy Evaluation Patient Details Name: Marissa Roman MRN: [id number] DOB: [birth year] Today's Date: 10/12/2023   History of Present Illness   Pt is a 63 y.o. female who underwent an elective right total knee arthroplasty on 10/11/2023.     Clinical Impressions Pt seen for OT evaluation this date, POD#1 from above surgery. Pt was Independent/Mod I in all ADLs prior to surgery, however occasionally using cane for mobility due to R knee pain. Pt lives alone however will be discharge home with her mother and sister who are able to provide care and support as needed.  Mother's house is a 2 level house with 2 STE, pt will have access to a 1st floor set-up and reports that there will be a ramp to access 1 of the step to enter the house however will be required to navigate the remaining step without ramp. Pt is eager to return to PLOF with less pain and improved safety and independence. Pt currently requires CGA for LB dressing while in seated position due to pain and limited AROM of R knee. Pt instructed in polar care mgt, falls prevention strategies, home/routines modifications, DME/AE for LB bathing and dressing tasks (including 3:1/BSC application), and compression stocking mgt. Pt would benefit from skilled OT services including additional instruction in dressing techniques with or without assistive devices for dressing and bathing skills to support recall and carryover prior to discharge and ultimately to maximize safety, independence, and minimize falls risk and caregiver burden. Do not currently anticipate any OT needs following this hospitalization.        If plan is discharge home, recommend the following:   A little help with walking and/or transfers;A little help with bathing/dressing/bathroom;Assistance with feeding;Assistance with cooking/housework     Functional Status Assessment   Patient has had a recent decline in their functional status and demonstrates the ability to  make significant improvements in function in a reasonable and predictable amount of time.     Equipment Recommendations   BSC/3in1;Other (comment) (RW)     Recommendations for Other Services         Precautions/Restrictions   Precautions Precautions: Fall Precaution/Restrictions Comments: Fall Restrictions Weight Bearing Restrictions Per Provider Order: Yes RUE Weight Bearing Per Provider Order: Weight bearing as tolerated     Mobility Bed Mobility Overal bed mobility: Needs Assistance Bed Mobility: Supine to Sit, Sit to Supine     Supine to sit: HOB elevated, Used rails, Contact guard Sit to supine: HOB elevated, Used rails, Contact guard assist   General bed mobility comments: Pt assists RLE during bed mobility    Transfers Overall transfer level: Needs assistance Equipment used: Rolling walker (2 wheels) Transfers: Sit to/from Stand, Bed to chair/wheelchair/BSC Sit to Stand: Contact guard assist           General transfer comment: Ambulated to bathroom to access BSC with CGA at RW level      Balance Overall balance assessment: Needs assistance   Sitting balance-Leahy Scale: Normal       Standing balance-Leahy Scale: Fair                             ADL either performed or assessed with clinical judgement   ADL Overall ADL's : Needs assistance/impaired                     Lower Body Dressing: Contact guard assist;Sit to/from stand;Cueing for compensatory techniques;Cueing for safety  Toilet Transfer: Contact guard assist;BSC/3in1;Rolling walker (2 wheels)   Toileting- Clothing Manipulation and Hygiene: Contact guard assist       Functional mobility during ADLs: Contact guard assist;Rolling walker (2 wheels);Cueing for safety       Vision Baseline Vision/History: 0 No visual deficits Patient Visual Report: No change from baseline       Perception         Praxis         Pertinent Vitals/Pain Pain  Assessment Pain Assessment: No/denies pain     Extremity/Trunk Assessment Upper Extremity Assessment Upper Extremity Assessment: Overall WFL for tasks assessed           Communication Communication Communication: No apparent difficulties   Cognition Arousal: Alert Behavior During Therapy: WFL for tasks assessed/performed Cognition: No apparent impairments                               Following commands: Intact       Cueing  General Comments   Cueing Techniques: Verbal cues      Exercises Other Exercises Other Exercises: Education on role of OT in Acute, education on fall risk management, Ted hose management, polar care application, utilizing 3:1/BSC, transfer safety, LB ADL task engagement with modified techniques, Energy Conservation techniques during ADLs.  Handout provided.   Shoulder Instructions      Home Living Family/patient expects to be discharged to:: Private residence Living Arrangements: Alone;Other (Comment) (Will discharge to mother's house) Available Help at Discharge: Family (Will discharge to mother's house) Type of Home: House (Pt's mother's house is a 2 level house with 1st floor set up for pt.) Home Access: Stairs to enter [employer] of Steps: 2 (will have ramp to access 1 step) however will need to access 1 step.  No rail   Home Layout: Two level;Able to live on main level with bedroom/bathroom Alternate Level Stairs-Number of Steps: Will discharge to mother's house, however will have access to a 1st floor set-up.   Bathroom Shower/Tub: Producer, television/film/video: Standard Bathroom Accessibility: Yes   Home Equipment: None   Additional Comments: Will discharge to mother's house, however will have access to a 1st floor set-up.      Prior Functioning/Environment Prior Level of Function : Independent/Modified Independent             Mobility Comments: Independent to Mod I with ambulation, reports using  cane ADLs Comments: Independent to Mod I with ADLs/IADLs, still works and drives    OT Problem List: Decreased strength;Decreased safety awareness;Decreased activity tolerance;Decreased knowledge of use of DME or AE;Impaired balance (sitting and/or standing)   OT Treatment/Interventions: Self-care/ADL training;Therapeutic activities;Energy conservation;Patient/family education;DME and/or AE instruction;Balance training      OT Goals(Current goals can be found in the care plan section)   Acute Rehab OT Goals Patient Stated Goal: To go home OT Goal Formulation: With patient Time For Goal Achievement: 10/26/23 Potential to Achieve Goals: Good ADL Goals Pt Will Perform Lower Body Dressing: with modified independence;sit to/from stand Pt Will Transfer to Toilet: with modified independence;bedside commode   OT Frequency:  Min 1X/week    Co-evaluation              AM-PAC OT 6 Clicks Daily Activity     Outcome Measure Help from another person eating meals?: None Help from another person taking care of personal grooming?: None Help from another person toileting, which includes using toliet,  bedpan, or urinal?: A Little Help from another person bathing (including washing, rinsing, drying)?: A Little Help from another person to put on and taking off regular upper body clothing?: None Help from another person to put on and taking off regular lower body clothing?: A Little 6 Click Score: 21   End of Session Equipment Utilized During Treatment: Rolling walker (2 wheels) Nurse Communication: Mobility status  Activity Tolerance: Patient tolerated treatment well Patient left: in bed;with call bell/phone within reach  OT Visit Diagnosis: Unsteadiness on feet (R26.81);Muscle weakness (generalized) (M62.81)                Time: 9096-9074 OT Time Calculation (min): 22 min Charges:  OT General Charges $OT Visit: 1 Visit OT Evaluation $OT Eval Low Complexity: 1 Low OT  Treatments $Self Care/Home Management : 8-22 mins  Harlene Sharps OTR/L   Harlene LITTIE Sharps 10/12/2023, 9:47 AM

## 2023-10-12 NOTE — Plan of Care (Signed)

## 2023-10-17 ENCOUNTER — Encounter: Payer: Self-pay | Admitting: Family

## 2023-10-17 DIAGNOSIS — I10 Essential (primary) hypertension: Secondary | ICD-10-CM

## 2023-10-17 MED ORDER — AMLODIPINE BESYLATE 10 MG PO TABS: 10.0000 mg | ORAL_TABLET | Freq: Every day | ORAL | 0 refills | Status: AC

## 2023-10-17 NOTE — Addendum Note (Signed)
 Addended by: ALBINO SHAVER C on: 10/17/2023 10:50 AM   Modules accepted: Orders

## 2024-01-11 ENCOUNTER — Other Ambulatory Visit: Payer: Self-pay | Admitting: Family

## 2024-01-11 DIAGNOSIS — E782 Mixed hyperlipidemia: Secondary | ICD-10-CM
